# Patient Record
Sex: Female | Born: 1965 | Race: White | Hispanic: No | Smoking: Former smoker
Health system: Southern US, Community
[De-identification: ages and names within clinical notes are randomized; demographics above are authoritative.]

## PROBLEM LIST (undated history)

## (undated) DIAGNOSIS — D649 Anemia, unspecified: Secondary | ICD-10-CM

## (undated) DIAGNOSIS — I10 Essential (primary) hypertension: Secondary | ICD-10-CM

## (undated) DIAGNOSIS — F419 Anxiety disorder, unspecified: Secondary | ICD-10-CM

## (undated) DIAGNOSIS — E039 Hypothyroidism, unspecified: Secondary | ICD-10-CM

## (undated) DIAGNOSIS — G8929 Other chronic pain: Secondary | ICD-10-CM

## (undated) DIAGNOSIS — M549 Dorsalgia, unspecified: Secondary | ICD-10-CM

## (undated) DIAGNOSIS — M1990 Unspecified osteoarthritis, unspecified site: Secondary | ICD-10-CM

## (undated) DIAGNOSIS — F32A Depression, unspecified: Secondary | ICD-10-CM

## (undated) DIAGNOSIS — F329 Major depressive disorder, single episode, unspecified: Secondary | ICD-10-CM

## (undated) HISTORY — DX: Anxiety disorder, unspecified: F41.9

## (undated) HISTORY — DX: Depression, unspecified: F32.A

## (undated) HISTORY — DX: Major depressive disorder, single episode, unspecified: F32.9

---
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
## 1988-11-12 HISTORY — PX: CHOLECYSTECTOMY: SHX55

## 1989-11-12 HISTORY — PX: TUBAL LIGATION: SHX77

## 2004-11-12 HISTORY — PX: ABDOMINAL HYSTERECTOMY: SHX81

## 2010-11-12 HISTORY — PX: OTHER SURGICAL HISTORY: SHX169

## 2012-07-19 ENCOUNTER — Emergency Department (HOSPITAL_COMMUNITY)
Admission: EM | Admit: 2012-07-19 | Discharge: 2012-07-19 | Disposition: A | Discharge status: Home / Self Care | Payer: 59 | Attending: Emergency Medicine | Admitting: Emergency Medicine

## 2012-07-19 ENCOUNTER — Encounter (HOSPITAL_COMMUNITY): Payer: Self-pay

## 2012-07-19 DIAGNOSIS — M519 Unspecified thoracic, thoracolumbar and lumbosacral intervertebral disc disorder: Secondary | ICD-10-CM | POA: Insufficient documentation

## 2012-07-19 DIAGNOSIS — M545 Low back pain, unspecified: Secondary | ICD-10-CM

## 2012-07-19 DIAGNOSIS — Z880 Allergy status to penicillin: Secondary | ICD-10-CM | POA: Insufficient documentation

## 2012-07-19 DIAGNOSIS — E079 Disorder of thyroid, unspecified: Secondary | ICD-10-CM | POA: Insufficient documentation

## 2012-07-19 DIAGNOSIS — I10 Essential (primary) hypertension: Secondary | ICD-10-CM | POA: Insufficient documentation

## 2012-07-19 DIAGNOSIS — G8929 Other chronic pain: Secondary | ICD-10-CM | POA: Insufficient documentation

## 2012-07-19 HISTORY — DX: Dorsalgia, unspecified: M54.9

## 2012-07-19 HISTORY — DX: Essential (primary) hypertension: I10

## 2012-07-19 HISTORY — DX: Other chronic pain: G89.29

## 2012-07-19 MED ORDER — HYDROMORPHONE HCL 4 MG PO TABS: 4.0000 mg | ORAL_TABLET | ORAL | Status: AC | PRN

## 2012-07-19 MED ORDER — HYDROMORPHONE HCL PF 2 MG/ML IJ SOLN
2.0000 mg | Freq: Once | INTRAMUSCULAR | Status: AC
  Administered 2012-07-19: 2 mg via INTRAMUSCULAR
  Filled 2012-07-19: qty 1

## 2012-07-19 NOTE — ED Notes (Signed)
Seeing [facility] and given prednisone and percocet for her pain, called md on call and sts no pain relief with meds given. Hx of bulging disc.

## 2012-07-19 NOTE — ED Provider Notes (Signed)
History   This chart was scribed for Geoffery Lyons, MD by Toya Smothers. The patient was seen in room TR05C/TR05C. Patient's care was started at 1607.  CSN: [id number]  Arrival date & time 07/19/12  1607   First MD Initiated Contact with Patient 07/19/12 1742      No chief complaint on file.  The history is provided by the patient. No language interpreter was used.    Julie Hale is a 46 y.o. female with a h/o chronic lower back pain who presents to the Emergency Department complaining of 1 week of severe constant worsening lower lumbar pain unlike any before. Pain is described as sharp and "knife-like" stabbing Pt denotes that she was evaluated by her PCP Friday for her worsening back pain and prescribed percocet and oxycodone which provide mild relief. Pt denotes associated tinging in her feet and worsening incontinence.  Pt lists PCP as Dr. Penni Bombard  Past Medical History  Diagnosis Date  . Back pain, chronic   . Thyroid disease   . Hypertension     No past surgical history on file.  No family history on file.  History  Substance Use Topics  . Smoking status: Never Smoker   . Smokeless tobacco: Not on file  . Alcohol Use: No   Review of Systems  Constitutional: Negative for fever.       10 Systems reviewed and are negative for acute change except as noted in the HPI.  HENT: Negative for congestion, neck pain and neck stiffness.   Eyes: Negative for discharge and redness.  Respiratory: Negative for cough and shortness of breath.   Cardiovascular: Negative for chest pain.  Gastrointestinal: Negative for vomiting and abdominal pain.  Musculoskeletal: Positive for back pain.  Skin: Negative for rash.  Neurological: Negative for syncope, numbness and headaches.  Psychiatric/Behavioral:       No behavior change.  All other systems reviewed and are negative.    Allergies  Erythromycin and Penicillins  Home Medications   Current Outpatient Rx  Name Route Sig Dispense  Refill  . LEVOTHYROXINE SODIUM 175 MCG PO TABS Oral Take 175 mcg by mouth at bedtime.    . METHOCARBAMOL 500 MG PO TABS Oral Take 500 mg by mouth every 6 (six) hours as needed. For muscle spasms    . METOPROLOL SUCCINATE ER 50 MG PO TB24 Oral Take 50 mg by mouth at bedtime. Take with or immediately following a meal.    . OXYCODONE-ACETAMINOPHEN 5-325 MG PO TABS Oral Take 1 tablet by mouth every 4 (four) hours as needed. For pain    . PREDNISONE 10 MG PO TABS Oral Take 10-60 mg by mouth See admin instructions. Take 60mg  on day 1, 50mg  on day 2, 40mg  on day 3, 30mg  on day 4, 20mg  on day 5 & 10mg  on day 6    . PRESCRIPTION MEDICATION Topical Apply 1 application topically daily. Medication:Topical Testosterone      BP 133/93  Pulse 108  Temp 98.1 F (36.7 C) (Oral)  Resp 22  SpO2 96%  Physical Exam  Nursing note and vitals reviewed. Constitutional: She is oriented to person, place, and time. She appears well-developed. No distress.  HENT:  Head: Normocephalic.  Eyes: Conjunctivae are normal.  Neck: No tracheal deviation present.  Cardiovascular:  No murmur heard. Musculoskeletal: Normal range of motion. She exhibits no edema.       Tender to palpation in the soft tissues of the lower lumbar. Strength is 5/5 in lower extremities.  She can stand on her toes.  Neurological: She is oriented to person, place, and time.  Skin: Skin is warm.  Psychiatric: She has a normal mood and affect.    ED Course  Procedures (including critical care time) DIAGNOSTIC STUDIES: Oxygen Saturation is 96% on room air, adequate by my interpretation.    COORDINATION OF CARE: 17:47- Evaluated Pt. Pt is awake, alert, and oriented.   Labs Reviewed - No data to display No results found.   No diagnosis found.    MDM  The patient presents with a flare of chronic low back pain.  She has a history of bulging discs and is due to have esi on Tuesday.  The medications prescribed by her pcp do not seem to be  helping.  She denies any bowel or bladder complaints, and exam is reassuring.  There are no signs of cauda equina or other emergent cause of this.  She was given im dilaudid and is feeling better.  She will be prescribed a few of these and is to follow up with her esi appointment next week.        I personally performed the services described in this documentation, which was scribed in my presence. The recorded information has been reviewed and considered.      Geoffery Lyons, MD 07/21/12 [phone]

## 2012-08-07 ENCOUNTER — Encounter (HOSPITAL_COMMUNITY): Payer: Self-pay

## 2012-08-08 ENCOUNTER — Ambulatory Visit (HOSPITAL_COMMUNITY)
Admission: RE | Admit: 2012-08-08 | Discharge: 2012-08-08 | Disposition: A | Discharge status: Home / Self Care | Payer: 59 | Source: Ambulatory Visit | Attending: Surgical | Admitting: Surgical

## 2012-08-08 ENCOUNTER — Encounter (HOSPITAL_COMMUNITY)
Admission: RE | Admit: 2012-08-08 | Discharge: 2012-08-08 | Disposition: A | Discharge status: Home / Self Care | Payer: 59 | Source: Ambulatory Visit | Attending: Orthopedic Surgery | Admitting: Orthopedic Surgery

## 2012-08-08 ENCOUNTER — Encounter (HOSPITAL_COMMUNITY): Payer: Self-pay

## 2012-08-08 DIAGNOSIS — M545 Low back pain, unspecified: Secondary | ICD-10-CM | POA: Insufficient documentation

## 2012-08-08 DIAGNOSIS — Z01810 Encounter for preprocedural cardiovascular examination: Secondary | ICD-10-CM | POA: Insufficient documentation

## 2012-08-08 DIAGNOSIS — Z01812 Encounter for preprocedural laboratory examination: Secondary | ICD-10-CM | POA: Insufficient documentation

## 2012-08-08 DIAGNOSIS — Z01818 Encounter for other preprocedural examination: Secondary | ICD-10-CM | POA: Insufficient documentation

## 2012-08-08 HISTORY — DX: Anemia, unspecified: D64.9

## 2012-08-08 HISTORY — DX: Unspecified osteoarthritis, unspecified site: M19.90

## 2012-08-08 HISTORY — DX: Hypothyroidism, unspecified: E03.9

## 2012-08-08 LAB — URINALYSIS, ROUTINE W REFLEX MICROSCOPIC
Bilirubin Urine: NEGATIVE
Glucose, UA: NEGATIVE mg/dL
Hgb urine dipstick: NEGATIVE
Ketones, ur: NEGATIVE mg/dL
Leukocytes, UA: NEGATIVE
Nitrite: NEGATIVE
Protein, ur: NEGATIVE mg/dL
Specific Gravity, Urine: 1.006 (ref 1.005–1.030)
Urobilinogen, UA: 0.2 mg/dL (ref 0.0–1.0)
pH: 6 (ref 5.0–8.0)

## 2012-08-08 LAB — SURGICAL PCR SCREEN: MRSA, PCR: NEGATIVE

## 2012-08-08 LAB — COMPREHENSIVE METABOLIC PANEL
ALT: 25 U/L (ref 0–35)
AST: 20 U/L (ref 0–37)
Albumin: 4 g/dL (ref 3.5–5.2)
Alkaline Phosphatase: 82 U/L (ref 39–117)
BUN: 11 mg/dL (ref 6–23)
CO2: 25 mEq/L (ref 19–32)
Calcium: 9.4 mg/dL (ref 8.4–10.5)
Chloride: 102 mEq/L (ref 96–112)
Creatinine, Ser: 0.85 mg/dL (ref 0.50–1.10)
GFR calc Af Amer: >90 mL/min (ref >90)
GFR calc non Af Amer: 80 mL/min — ABNORMAL LOW (ref >90)
Glucose, Bld: 101 mg/dL — ABNORMAL HIGH (ref 70–99)
Potassium: 5.1 mEq/L (ref 3.5–5.1)
Sodium: 136 mEq/L (ref 135–145)
Total Bilirubin: 0.4 mg/dL (ref 0.3–1.2)
Total Protein: 7.6 g/dL (ref 6.0–8.3)

## 2012-08-08 LAB — APTT: aPTT: 28 seconds (ref 24–37)

## 2012-08-08 LAB — PROTIME-INR
INR: 0.99 (ref 0.00–1.49)
Prothrombin Time: 13 seconds (ref 11.6–15.2)

## 2012-08-08 LAB — CBC
MCH: 32 pg (ref 26.0–34.0)
MCV: 95.1 fL (ref 78.0–100.0)
Platelets: 293 10*3/uL (ref 150–400)
RDW: 13 % (ref 11.5–15.5)
WBC: 13.7 10*3/uL — ABNORMAL HIGH (ref 4.0–10.5)

## 2012-08-08 NOTE — Patient Instructions (Addendum)
20 Tiye Huwe  08/08/2012   Your procedure is scheduled on:  08-14-2012  Report to [facility] at 0930  AM.  Call this number if you have problems the morning of surgery: [phone]   Remember:   Do not eat food or drink liquids:After Midnight.  .  Take these medicines the morning of surgery with A SIP OF WATER: oxycodone if needed, levothyroxine, metorprolol succinate, prednisone if still taking   Do not wear jewelry or make up.  Do not wear lotions, powders, or perfumes. You may wear deodorant.    Do not bring valuables to the hospital.  Contacts, dentures or bridgework may not be worn into surgery.  Leave suitcase in the car. After surgery it may be brought to your room.  For patients admitted to the hospital, checkout time is 11:00 AM the day of discharge                             Patients discharged the day of surgery will not be allowed to drive home. If going home same day of surgery, you must have someone stay with you the first 24 hours at home and arrange for some one to drive you home from hospital.    Special Instructions: See [facility] Preparing for Surgery instruction sheet. Women do not shave legs or underarms for 12 hours before showers. Men may shave face morning of surgery.    Please read over the following fact sheets that you were given: MRSA Information  [facility] pre op nurse phone number [phone], call if needed

## 2012-08-12 NOTE — Progress Notes (Signed)
Left pt message surgery time changed to 930 am, arrive 0700 am [facility] 08-14-12

## 2012-08-12 NOTE — Progress Notes (Signed)
Spoke with pt by phone aware surgery time changed to 930 am arrive 0700 am 08-14-12 [facility]

## 2012-08-12 NOTE — H&P (Signed)
  Julie Hale DOB: 1966-08-18  Chief Compliant: back pain   History of Present Illness The patient is a 46 year old female who is scheduled for a lumbar laminectomy and microdisectomy by Dr. Darrelyn Hillock on August 14, 2012. The patient reports low back symptoms including low back pain which began 6 week without any known injury. and Symptoms include pain, numbness and tingling. The pain radiates to the left buttock, left thigh, left lower leg and left foot. The patient describes the pain as sharp and tingling. The patient describes the severity of their symptoms as moderate in severity. The patient feels as if the symptoms are worsening. Symptoms are exacerbated by standing, sitting, lifting and bending. Associated symptoms include urinary incontinence. Current treatment includes opioid analgesics, which is not helping much. Prior to being seen today the patient was previously evaluated by a colleague Dr. Penni Bombard.  The bottom line is she has a large lateral recess herniated disc at L2-3.     Allergies Penicillin Erythromicin   Medication History Percocet (5-325MG  Tablet, 1 (one) Tablet Oral every 6-8 hours as needed for pain,  Active. Naproxen (500MG  Tablet, 1 (one) Oral two times daily, ) Active. (with food) Levothyroxine Sodium ( Intravenous) - Active. Methocarbamol (500MG  Tablet, Oral) Active. Metoprolol Succinate ( Oral) 50mg  - Active. Testosterone Aqueous ( Intramuscular)  - Active. VESIcare ( Oral)  - Active. Dilaudid 4mg    Review of Systems General:Not Present- Chills, Fever, Night Sweats, Appetite Loss, Fatigue, Feeling sick, Weight Gain and Weight Loss. Skin:Not Present- Itching, Rash, Skin Color Changes, Ulcer, Psoriasis and Change in Hair or Nails. HEENT:Not Present- Sensitivity to light, Nose Bleed, Visual Loss, Decreased Hearing and Ringing in the Ears. Neck:Not Present- Swollen Glands and Neck Mass. Respiratory:Not Present- Shortness of breath,  Snoring, Chronic Cough and Bloody sputum. Cardiovascular:Not Present- Shortness of Breath, Chest Pain, Swelling of Extremities, Leg Cramps and Palpitations. Gastrointestinal:Present- Heartburn. Not Present- Bloody Stool, Abdominal Pain, Vomiting, Nausea and Incontinence of Stool. Female Genitourinary:Not Present- Blood in Urine, Irregular/missing periods, Frequency, Incontinence and Nocturia. Musculoskeletal:Present- Muscle Weakness, Joint Stiffness, Joint Swelling, Joint Pain and Back Pain. Not Present- Muscle Pain. Neurological:Not Present- Tingling, Numbness, Burning, Tremor, Headaches and Dizziness. Psychiatric:Not Present- Anxiety, Depression and Memory Loss. Endocrine:Present- Cold Intolerance. Not Present- Heat Intolerance, Excessive hunger and Excessive Thirst. Hematology:Not Present- Abnormal Bleeding, Abnormal bruising, Anemia and Blood Clots.    Physical Exam She has numbness lateral aspect on exam of the left leg. Muscle testing exam is surprisingly intact except her hip flexors are weak on the left. Back exam is painful when she sits she leans toward the right. Lungs are clear to auscultation Heart - normal sinus rhythm, no murmur. Oral cavity negative Neck: supple, no bruit Abdomen: soft and nontender    RADIOGRAPHS: On AP view she has sixlumbar vertebrae but if you count up from the bottom up L5-S1 is the last space.     Assessment & Plan Lumbar disc herniation (722.10) She will need a lumbar laminectomy and microdiscectomy at L2-3 on the left. Dr. Darrelyn Hillock went over possible complications which are extremely rare.       Dimitri Ped, PA-C

## 2012-08-14 ENCOUNTER — Encounter (HOSPITAL_COMMUNITY): Payer: Self-pay | Admitting: *Deleted

## 2012-08-14 ENCOUNTER — Inpatient Hospital Stay (HOSPITAL_COMMUNITY): Payer: 59 | Admitting: Anesthesiology

## 2012-08-14 ENCOUNTER — Encounter (HOSPITAL_COMMUNITY)
Admission: RE | Disposition: A | Discharge status: Home / Self Care | Payer: Self-pay | Source: Ambulatory Visit | Attending: Orthopedic Surgery

## 2012-08-14 ENCOUNTER — Inpatient Hospital Stay (HOSPITAL_COMMUNITY): Payer: 59

## 2012-08-14 ENCOUNTER — Encounter (HOSPITAL_COMMUNITY): Payer: Self-pay

## 2012-08-14 ENCOUNTER — Encounter (HOSPITAL_COMMUNITY): Payer: Self-pay | Admitting: Anesthesiology

## 2012-08-14 ENCOUNTER — Inpatient Hospital Stay (HOSPITAL_COMMUNITY)
Admission: RE | Admit: 2012-08-14 | Discharge: 2012-08-15 | DRG: 491 | Disposition: A | Discharge status: Home / Self Care | Payer: 59 | Source: Ambulatory Visit | Attending: Orthopedic Surgery | Admitting: Orthopedic Surgery

## 2012-08-14 DIAGNOSIS — Z881 Allergy status to other antibiotic agents status: Secondary | ICD-10-CM

## 2012-08-14 DIAGNOSIS — N39498 Other specified urinary incontinence: Secondary | ICD-10-CM | POA: Diagnosis present

## 2012-08-14 DIAGNOSIS — Z880 Allergy status to penicillin: Secondary | ICD-10-CM

## 2012-08-14 DIAGNOSIS — Z791 Long term (current) use of non-steroidal anti-inflammatories (NSAID): Secondary | ICD-10-CM

## 2012-08-14 DIAGNOSIS — M5106 Intervertebral disc disorders with myelopathy, lumbar region: Principal | ICD-10-CM | POA: Diagnosis present

## 2012-08-14 HISTORY — PX: LUMBAR LAMINECTOMY/DECOMPRESSION MICRODISCECTOMY: SHX5026

## 2012-08-14 SURGERY — LUMBAR LAMINECTOMY/DECOMPRESSION MICRODISCECTOMY
Anesthesia: General | Site: Back | Laterality: Left | Wound class: Clean

## 2012-08-14 MED ORDER — HYDROMORPHONE HCL PF 1 MG/ML IJ SOLN
0.2500 mg | INTRAMUSCULAR | Status: DC | PRN
  Administered 2012-08-14 (×2): 0.5 mg via INTRAVENOUS

## 2012-08-14 MED ORDER — LACTATED RINGERS IV SOLN
INTRAVENOUS | Status: DC
  Administered 2012-08-14: 21:00:00 via INTRAVENOUS

## 2012-08-14 MED ORDER — DEXAMETHASONE SODIUM PHOSPHATE 10 MG/ML IJ SOLN
INTRAMUSCULAR | Status: DC | PRN
  Administered 2012-08-14: 10 mg via INTRAVENOUS

## 2012-08-14 MED ORDER — MEPERIDINE HCL 50 MG/ML IJ SOLN: 6.2500 mg | INTRAMUSCULAR | Status: DC | PRN

## 2012-08-14 MED ORDER — ACETAMINOPHEN 325 MG PO TABS: 650.0000 mg | ORAL_TABLET | ORAL | Status: DC | PRN

## 2012-08-14 MED ORDER — PROMETHAZINE HCL 25 MG/ML IJ SOLN
6.2500 mg | INTRAMUSCULAR | Status: DC | PRN
  Administered 2012-08-14: 12.5 mg via INTRAVENOUS

## 2012-08-14 MED ORDER — METHOCARBAMOL 500 MG PO TABS
500.0000 mg | ORAL_TABLET | Freq: Four times a day (QID) | ORAL | Status: DC | PRN
  Administered 2012-08-14 – 2012-08-15 (×3): 500 mg via ORAL
  Filled 2012-08-14 (×3): qty 1

## 2012-08-14 MED ORDER — METOPROLOL SUCCINATE ER 50 MG PO TB24
50.0000 mg | ORAL_TABLET | Freq: Every day | ORAL | Status: DC
  Administered 2012-08-15: 50 mg via ORAL
  Filled 2012-08-14: qty 1

## 2012-08-14 MED ORDER — OXYCODONE HCL 5 MG/5ML PO SOLN
5.0000 mg | Freq: Once | ORAL | Status: DC | PRN
  Filled 2012-08-14: qty 5

## 2012-08-14 MED ORDER — LACTATED RINGERS IV SOLN
INTRAVENOUS | Status: DC
  Administered 2012-08-14: 10:00:00 via INTRAVENOUS
  Administered 2012-08-14: 1000 mL via INTRAVENOUS
  Administered 2012-08-14: 12:00:00 via INTRAVENOUS

## 2012-08-14 MED ORDER — FENTANYL CITRATE 0.05 MG/ML IJ SOLN
100.0000 ug | Freq: Once | INTRAMUSCULAR | Status: AC
  Administered 2012-08-14: 100 ug via INTRAVENOUS

## 2012-08-14 MED ORDER — MIDAZOLAM HCL 5 MG/5ML IJ SOLN
INTRAMUSCULAR | Status: DC | PRN
  Administered 2012-08-14: 2 mg via INTRAVENOUS

## 2012-08-14 MED ORDER — FENTANYL CITRATE 0.05 MG/ML IJ SOLN
INTRAMUSCULAR | Status: DC | PRN
  Administered 2012-08-14 (×2): 50 ug via INTRAVENOUS
  Administered 2012-08-14: 100 ug via INTRAVENOUS
  Administered 2012-08-14: 50 ug via INTRAVENOUS

## 2012-08-14 MED ORDER — OXYCODONE-ACETAMINOPHEN 5-325 MG PO TABS
1.0000 | ORAL_TABLET | ORAL | Status: DC | PRN
  Administered 2012-08-14 – 2012-08-15 (×5): 2 via ORAL
  Filled 2012-08-14 (×6): qty 2

## 2012-08-14 MED ORDER — ROCURONIUM BROMIDE 100 MG/10ML IV SOLN
INTRAVENOUS | Status: DC | PRN
  Administered 2012-08-14: 50 mg via INTRAVENOUS

## 2012-08-14 MED ORDER — SODIUM CHLORIDE 0.9 % IR SOLN
Status: DC | PRN
  Administered 2012-08-14: 11:00:00

## 2012-08-14 MED ORDER — EPHEDRINE SULFATE 50 MG/ML IJ SOLN
INTRAMUSCULAR | Status: DC | PRN
  Administered 2012-08-14 (×2): 5 mg via INTRAVENOUS

## 2012-08-14 MED ORDER — CLINDAMYCIN PHOSPHATE 900 MG/50ML IV SOLN
900.0000 mg | INTRAVENOUS | Status: AC
  Administered 2012-08-14: 900 mg via INTRAVENOUS
  Filled 2012-08-14: qty 50

## 2012-08-14 MED ORDER — NEOSTIGMINE METHYLSULFATE 1 MG/ML IJ SOLN
INTRAMUSCULAR | Status: DC | PRN
  Administered 2012-08-14: 3 mg via INTRAVENOUS

## 2012-08-14 MED ORDER — PHENOL 1.4 % MT LIQD
1.0000 | OROMUCOSAL | Status: DC | PRN
  Filled 2012-08-14: qty 177

## 2012-08-14 MED ORDER — HYDROMORPHONE HCL PF 1 MG/ML IJ SOLN
0.5000 mg | INTRAMUSCULAR | Status: DC | PRN
  Administered 2012-08-14 – 2012-08-15 (×2): 1 mg via INTRAVENOUS
  Filled 2012-08-14 (×2): qty 1

## 2012-08-14 MED ORDER — ACETAMINOPHEN 10 MG/ML IV SOLN: 1000.0000 mg | Freq: Once | INTRAVENOUS | Status: DC | PRN

## 2012-08-14 MED ORDER — BUPIVACAINE LIPOSOME 1.3 % IJ SUSP
20.0000 mL | INTRAMUSCULAR | Status: AC
  Administered 2012-08-14: 15 mL
  Filled 2012-08-14: qty 20

## 2012-08-14 MED ORDER — BACITRACIN-NEOMYCIN-POLYMYXIN 400-5-5000 EX OINT
TOPICAL_OINTMENT | CUTANEOUS | Status: DC | PRN
  Administered 2012-08-14: 1 via TOPICAL

## 2012-08-14 MED ORDER — THROMBIN 5000 UNITS EX SOLR
CUTANEOUS | Status: DC | PRN
  Administered 2012-08-14: 5000 [IU] via TOPICAL

## 2012-08-14 MED ORDER — BUPIVACAINE HCL (PF) 0.25 % IJ SOLN
INTRAMUSCULAR | Status: DC | PRN
  Administered 2012-08-14: 20 mL

## 2012-08-14 MED ORDER — LEVOTHYROXINE SODIUM 175 MCG PO TABS
175.0000 ug | ORAL_TABLET | Freq: Every day | ORAL | Status: DC
  Administered 2012-08-15: 175 ug via ORAL
  Filled 2012-08-14 (×2): qty 1

## 2012-08-14 MED ORDER — GLYCOPYRROLATE 0.2 MG/ML IJ SOLN
INTRAMUSCULAR | Status: DC | PRN
  Administered 2012-08-14: 0.4 mg via INTRAVENOUS

## 2012-08-14 MED ORDER — LIDOCAINE HCL (CARDIAC) 20 MG/ML IV SOLN
INTRAVENOUS | Status: DC | PRN
  Administered 2012-08-14: 80 mg via INTRAVENOUS

## 2012-08-14 MED ORDER — ONDANSETRON HCL 4 MG/2ML IJ SOLN
INTRAMUSCULAR | Status: DC | PRN
  Administered 2012-08-14: 4 mg via INTRAVENOUS

## 2012-08-14 MED ORDER — ONDANSETRON HCL 4 MG/2ML IJ SOLN: 4.0000 mg | INTRAMUSCULAR | Status: DC | PRN

## 2012-08-14 MED ORDER — ACETAMINOPHEN 10 MG/ML IV SOLN
INTRAVENOUS | Status: DC | PRN
  Administered 2012-08-14: 1000 mg via INTRAVENOUS

## 2012-08-14 MED ORDER — PROMETHAZINE HCL 25 MG/ML IJ SOLN
INTRAMUSCULAR | Status: AC
  Filled 2012-08-14: qty 1

## 2012-08-14 MED ORDER — OXYCODONE HCL 5 MG PO TABS: 5.0000 mg | ORAL_TABLET | Freq: Once | ORAL | Status: DC | PRN

## 2012-08-14 MED ORDER — SCOPOLAMINE 1 MG/3DAYS TD PT72
1.0000 | MEDICATED_PATCH | Freq: Once | TRANSDERMAL | Status: DC
  Administered 2012-08-14: 1.5 mg via TRANSDERMAL
  Filled 2012-08-14: qty 1

## 2012-08-14 MED ORDER — PROPOFOL 10 MG/ML IV BOLUS
INTRAVENOUS | Status: DC | PRN
  Administered 2012-08-14: 180 mg via INTRAVENOUS

## 2012-08-14 MED ORDER — ACETAMINOPHEN 650 MG RE SUPP: 650.0000 mg | RECTAL | Status: DC | PRN

## 2012-08-14 MED ORDER — DEXTROSE 5 % IV SOLN
500.0000 mg | Freq: Four times a day (QID) | INTRAVENOUS | Status: DC | PRN
  Administered 2012-08-14: 500 mg via INTRAVENOUS
  Filled 2012-08-14: qty 5

## 2012-08-14 MED ORDER — MENTHOL 3 MG MT LOZG
1.0000 | LOZENGE | OROMUCOSAL | Status: DC | PRN
  Filled 2012-08-14: qty 9

## 2012-08-14 MED ORDER — CLINDAMYCIN PHOSPHATE 900 MG/50ML IV SOLN
900.0000 mg | Freq: Three times a day (TID) | INTRAVENOUS | Status: AC
  Administered 2012-08-14 – 2012-08-15 (×2): 900 mg via INTRAVENOUS
  Filled 2012-08-14 (×2): qty 50

## 2012-08-14 SURGICAL SUPPLY — 42 items
BAG ZIPLOCK 12X15 (MISCELLANEOUS) ×2 IMPLANT
BENZOIN TINCTURE PRP APPL 2/3 (GAUZE/BANDAGES/DRESSINGS) ×2 IMPLANT
CLEANER TIP ELECTROSURG 2X2 (MISCELLANEOUS) ×2 IMPLANT
CLOTH BEACON ORANGE TIMEOUT ST (SAFETY) ×2 IMPLANT
CONT SPECI 4OZ STER CLIK (MISCELLANEOUS) ×2 IMPLANT
DRAIN PENROSE 18X1/4 LTX STRL (WOUND CARE) IMPLANT
DRAPE MICROSCOPE LEICA (MISCELLANEOUS) ×2 IMPLANT
DRAPE POUCH INSTRU U-SHP 10X18 (DRAPES) ×2 IMPLANT
DRAPE SURG 17X11 SM STRL (DRAPES) ×2 IMPLANT
DRSG ADAPTIC 3X8 NADH LF (GAUZE/BANDAGES/DRESSINGS) ×2 IMPLANT
DRSG PAD ABDOMINAL 8X10 ST (GAUZE/BANDAGES/DRESSINGS) ×4 IMPLANT
DURAPREP 26ML APPLICATOR (WOUND CARE) ×2 IMPLANT
ELECT REM PT RETURN 9FT ADLT (ELECTROSURGICAL) ×2
ELECTRODE REM PT RTRN 9FT ADLT (ELECTROSURGICAL) ×1 IMPLANT
GLOVE BIOGEL PI IND STRL 8 (GLOVE) ×1 IMPLANT
GLOVE BIOGEL PI INDICATOR 8 (GLOVE) ×1
GLOVE ECLIPSE 8.0 STRL XLNG CF (GLOVE) ×4 IMPLANT
GOWN PREVENTION PLUS LG XLONG (DISPOSABLE) ×4 IMPLANT
GOWN STRL REIN XL XLG (GOWN DISPOSABLE) ×4 IMPLANT
KIT BASIN OR (CUSTOM PROCEDURE TRAY) ×2 IMPLANT
KIT POSITIONING SURG ANDREWS (MISCELLANEOUS) ×2 IMPLANT
MANIFOLD NEPTUNE II (INSTRUMENTS) ×2 IMPLANT
NEEDLE SPNL 18GX3.5 QUINCKE PK (NEEDLE) ×4 IMPLANT
NS IRRIG 1000ML POUR BTL (IV SOLUTION) IMPLANT
PATTIES SURGICAL .5 X.5 (GAUZE/BANDAGES/DRESSINGS) IMPLANT
PATTIES SURGICAL .75X.75 (GAUZE/BANDAGES/DRESSINGS) IMPLANT
PATTIES SURGICAL 1X1 (DISPOSABLE) IMPLANT
PIN SAFETY NICK PLATE  2 MED (MISCELLANEOUS)
PIN SAFETY NICK PLATE 2 MED (MISCELLANEOUS) IMPLANT
POSITIONER SURGICAL ARM (MISCELLANEOUS) IMPLANT
SPONGE GAUZE 4X4 12PLY (GAUZE/BANDAGES/DRESSINGS) ×2 IMPLANT
SPONGE LAP 4X18 X RAY DECT (DISPOSABLE) ×6 IMPLANT
SPONGE SURGIFOAM ABS GEL 100 (HEMOSTASIS) ×2 IMPLANT
STAPLER VISISTAT 35W (STAPLE) ×2 IMPLANT
SUT VIC AB 0 CT1 27 (SUTURE) ×1
SUT VIC AB 0 CT1 27XBRD ANTBC (SUTURE) ×1 IMPLANT
SUT VIC AB 1 CT1 27 (SUTURE) ×3
SUT VIC AB 1 CT1 27XBRD ANTBC (SUTURE) ×3 IMPLANT
TAPE CLOTH SURG 6X10 WHT LF (GAUZE/BANDAGES/DRESSINGS) ×2 IMPLANT
TOWEL OR 17X26 10 PK STRL BLUE (TOWEL DISPOSABLE) ×4 IMPLANT
TRAY LAMINECTOMY (CUSTOM PROCEDURE TRAY) ×2 IMPLANT
WATER STERILE IRR 1500ML POUR (IV SOLUTION) IMPLANT

## 2012-08-14 NOTE — Anesthesia Postprocedure Evaluation (Signed)
Anesthesia Post Note  Patient: [name]  Procedure(s) Performed: Procedure(s) (LRB): LUMBAR LAMINECTOMY/DECOMPRESSION MICRODISCECTOMY (Left)  Anesthesia type: General  Patient location: PACU  Post pain: Pain level controlled  Post assessment: Post-op Vital signs reviewed  Last Vitals: BP 133/73  Pulse 80  Temp 37.1 C (Oral)  Resp 17  SpO2 99%  Post vital signs: Reviewed  Level of consciousness: sedated  Complications: No apparent anesthesia complications

## 2012-08-14 NOTE — Transfer of Care (Signed)
Immediate Anesthesia Transfer of Care Note  Patient: Julie Hale  Procedure(s) Performed: Procedure(s) (LRB) with comments: LUMBAR LAMINECTOMY/DECOMPRESSION MICRODISCECTOMY (Left) - lumbar 2- 3 left  Patient Location: PACU  Anesthesia Type: General  Level of Consciousness: awake, alert  and oriented  Airway & Oxygen Therapy: Patient Spontanous Breathing and Patient connected to face mask oxygen  Post-op Assessment: Report given to PACU RN and Post -op Vital signs reviewed and stable  Post vital signs: Reviewed and stable  Complications: No apparent anesthesia complications

## 2012-08-14 NOTE — Anesthesia Preprocedure Evaluation (Addendum)
Anesthesia Evaluation  Patient identified by MRN, date of birth, ID band Patient awake    Reviewed: Allergy & Precautions, H&P , NPO status , Patient's Chart, lab work & pertinent test results, reviewed documented beta blocker date and time   Airway Mallampati: II TM Distance: >3 FB Neck ROM: Full    Dental  (+) Dental Advisory Given and Teeth Intact   Pulmonary former smoker,  breath sounds clear to auscultation  Pulmonary exam normal       Cardiovascular hypertension, Pt. on medications and Pt. on home beta blockers - Past MI Rhythm:Regular Rate:Normal     Neuro/Psych negative neurological ROS  negative psych ROS   GI/Hepatic negative GI ROS, Neg liver ROS,   Endo/Other  Hypothyroidism Morbid obesity  Renal/GU negative Renal ROS     Musculoskeletal   Abdominal (+) + obese,   Peds  Hematology  (+) anemia ,   Anesthesia Other Findings   Reproductive/Obstetrics                         Anesthesia Physical Anesthesia Plan  ASA: III  Anesthesia Plan: General   Post-op Pain Management:    Induction: Intravenous  Airway Management Planned: Oral ETT  Additional Equipment:   Intra-op Plan:   Post-operative Plan: Extubation in OR  Informed Consent: I have reviewed the patients History and Physical, chart, labs and discussed the procedure including the risks, benefits and alternatives for the proposed anesthesia with the patient or authorized representative who has indicated his/her understanding and acceptance.   Dental advisory given  Plan Discussed with: CRNA  Anesthesia Plan Comments:         Anesthesia Quick Evaluation

## 2012-08-14 NOTE — Interval H&P Note (Signed)
History and Physical Interval Note:  08/14/2012 8:58 AM  Julie Hale  has presented today for surgery, with the diagnosis of lumbar disc rupture L2-L3 on left  The various methods of treatment have been discussed with the patient and family. After consideration of risks, benefits and other options for treatment, the patient has consented to  Procedure(s) (LRB) with comments: LUMBAR LAMINECTOMY/DECOMPRESSION MICRODISCECTOMY (Left) as a surgical intervention .  The patient's history has been reviewed, patient examined, no change in status, stable for surgery.  I have reviewed the patient's chart and labs.  Questions were answered to the patient's satisfaction.     Julie Hale A

## 2012-08-14 NOTE — Brief Op Note (Signed)
08/14/2012  11:39 AM  PATIENT:  Julie Hale  46 y.o. female  PRE-OPERATIVE DIAGNOSIS:  lumbar disc rupture L2-L3 on left with Spinal Stenosis  POST-OPERATIVE DIAGNOSIS:  lumbar disc rupture L2-L3 on left with Spinal Stenosis  PROCEDURE:  Procedure(s) (LRB) with comments: LUMBAR LAMINECTOMY/DECOMPRESSION MICRODISCECTOMY (Left) - lumbar 2- 3 left and foraminotomies for TWO ROOTS,L-2 and L-3 Nerve roots were decompressed.  SURGEON:  Surgeon(s) and Role:    * Jacki Cones, MD - Primary    * Javier Docker, MD - Assisting     ASSISTANTS: Jene Every MD }  ANESTHESIA:   general  EBL:  Total I/O In: 1000 [I.V.:1000] Out: -   BLOOD ADMINISTERED:none  DRAINS: none   LOCAL MEDICATIONS USED:  MARCAINE 0.25%,20cc with Epinephrine. At end of case,Bupivicaine 20cc mixed with 10cc of Normal Saline    SPECIMEN:  Source of Specimen:  L-2___  L-3 Disc  DISPOSITION OF SPECIMEN:  PATHOLOGY  COUNTS:  YES  TOURNIQUET:  * No tourniquets in log *  DICTATION: .Other Dictation: Dictation Number [id number]  PLAN OF CARE: Admit to inpatient   PATIENT DISPOSITION:  PACU - hemodynamically stable.   Delay start of Pharmacological VTE agent (>24hrs) due to surgical blood loss or risk of bleeding: yes

## 2012-08-15 ENCOUNTER — Encounter (HOSPITAL_COMMUNITY): Payer: Self-pay | Admitting: Orthopedic Surgery

## 2012-08-15 MED ORDER — METHOCARBAMOL 500 MG PO TABS: 500.0000 mg | ORAL_TABLET | Freq: Four times a day (QID) | ORAL | Status: DC | PRN

## 2012-08-15 MED ORDER — OXYCODONE-ACETAMINOPHEN 5-325 MG PO TABS: 1.0000 | ORAL_TABLET | ORAL | Status: DC | PRN

## 2012-08-15 MED FILL — Bupivacaine Inj 0.5% w/ Epinephrine 1:200000 (PF): INTRAMUSCULAR | Qty: 30 | Status: AC

## 2012-08-15 MED FILL — Bacitracin Zinc Oint 500 Unit/GM: CUTANEOUS | Qty: 15 | Status: AC

## 2012-08-15 MED FILL — Thrombin For Soln 5000 Unit: CUTANEOUS | Qty: 5000 | Status: AC

## 2012-08-15 NOTE — Evaluation (Signed)
Occupational Therapy Evaluation Patient Details Name: Julie Hale MRN: [id number] DOB: Apr 01, 1966 Today's Date: 08/15/2012 Time: 4098-1191 OT Time Calculation (min): 22 min  OT Assessment / Plan / Recommendation Clinical Impression  This 46 year old female underwent decompression of L2-3.  All education was completed, and she does not need any further OT.      OT Assessment       Follow Up Recommendations  No OT follow up    Barriers to Discharge      Equipment Recommendations  Other (comment) (RW 5" wheels/3:1 commode unless she can borrow)    Recommendations for Other Services    Frequency       Precautions / Restrictions Precautions Precautions: Back Precaution Booklet Issued: Yes (comment) Precaution Comments: Pt recalled all back precautions at end of session Restrictions Weight Bearing Restrictions: No   Pertinent Vitals/Pain 5/10 back; repositioned    ADL  Lower Body Bathing: Simulated;Minimal assistance Where Assessed - Lower Body Bathing: Supported sit to stand Lower Body Dressing: Simulated;Moderate assistance Where Assessed - Lower Body Dressing: Supported sit to stand Toilet Transfer: Counsellor Method: Sit to Barista:  (chair) Transfers/Ambulation Related to ADLs: Pt min guard for sit to stand and ambulation in room.  Did not feel she needed to practice shower transfer:  reviewed sequence, and she has grab bars, help from husband ADL Comments: reviewed adls with back precautions and handout provided.  Pt will be most independent lying down for LB ADLs. Does not have AE.  Will likely need toilet aid:  resource list given    OT Diagnosis:    OT Problem List:   OT Treatment Interventions:     OT Goals    Visit Information  Last OT Received On: 08/15/12 Assistance Needed: +1    Subjective Data  Subjective: someone told me I could borrow equipment:  I have to figure out who it was.     Prior  Functioning     Home Living Lives With: Spouse Available Help at Discharge: Family Type of Home: House Home Access: Stairs to enter Secretary/administrator of Steps: 1 Home Layout: One level Bathroom Shower/Tub: Walk-in shower (grab bar) Bathroom Toilet: Standard (grab bar) Home Adaptive Equipment: None (Pt states can borrow RW and will check on same) Prior Function Level of Independence: Independent Able to Take Stairs?: Yes Driving: Yes Vocation: Full time employment Communication Communication: No difficulties         Vision/Perception     Cognition  Overall Cognitive Status: Appears within functional limits for tasks assessed/performed Arousal/Alertness: Awake/alert Orientation Level: Appears intact for tasks assessed Behavior During Session: [facility] for tasks performed    Extremity/Trunk Assessment Right Upper Extremity Assessment RUE ROM/Strength/Tone: [facility] for tasks assessed Left Upper Extremity Assessment LUE ROM/Strength/Tone: [facility] for tasks assessed Right Lower Extremity Assessment RLE ROM/Strength/Tone: [facility] for tasks assessed Left Lower Extremity Assessment LLE ROM/Strength/Tone: WFL for tasks assessed     Mobility Bed Mobility Bed Mobility: Rolling Right;Sit to Supine Rolling Right: 5: Supervision Sit to Supine: 5: Supervision Details for Bed Mobility Assistance: min cues for correct log roll technique Transfers Sit to Stand: 4: Min guard Stand to Sit: 4: Min guard;5: Supervision Details for Transfer Assistance: cues for use of UEs, pt did well limiting fwd flex     Shoulder Instructions     Exercise     Balance     End of Session OT - End of Session Activity Tolerance: Patient tolerated treatment well Patient  left: in chair;with call bell/phone within reach  GO     Julie Hale 08/15/2012, 9:25 AM Marica Otter, OTR/L [phone] 08/15/2012

## 2012-08-15 NOTE — Evaluation (Signed)
Physical Therapy Evaluation Patient Details Name: Julie Hale MRN: [id number] DOB: 1966/07/06 Today's Date: 08/15/2012 Time: 0805-0825 PT Time Calculation (min): 20 min  PT Assessment / Plan / Recommendation Clinical Impression  Pt sp back surgery presents with functional mobility limitations 2* back pain and post op back precautions    PT Assessment  Patient needs continued PT services    Follow Up Recommendations  No PT follow up    Barriers to Discharge        Equipment Recommendations  None recommended by PT (Pt states can borrow RW)    Recommendations for Other Services OT consult   Frequency 7X/week    Precautions / Restrictions Precautions Precautions: Back Precaution Booklet Issued: Yes (comment) Precaution Comments: Pt recalled all back precautions at end of session Restrictions Weight Bearing Restrictions: No   Pertinent Vitals/Pain 5/10 premecidated      Mobility  Bed Mobility Bed Mobility: Rolling Right;Sit to Supine Rolling Right: 5: Supervision Sit to Supine: 5: Supervision Details for Bed Mobility Assistance: min cues for correct log roll technique Transfers Transfers: Sit to Stand;Stand to Sit Sit to Stand: 4: Min guard;5: Supervision Stand to Sit: 4: Min guard;5: Supervision Details for Transfer Assistance: cues for use of UEs, pt did well limiting fwd flex Ambulation/Gait Ambulation/Gait Assistance: 4: Min guard;5: Supervision Ambulation Distance (Feet): 200 Feet Assistive device: Rolling walker Ambulation/Gait Assistance Details: cues for posture and position from RW Gait Pattern: Step-to pattern;Step-through pattern Stairs:  (Reviewed step up verbally, )    Shoulder Instructions     Exercises     PT Diagnosis: Difficulty walking  PT Problem List: Decreased activity tolerance;Decreased mobility;Decreased knowledge of use of DME;Pain PT Treatment Interventions: DME instruction;Gait training;Stair training;Functional mobility  training;Therapeutic activities;Patient/family education   PT Goals Acute Rehab PT Goals PT Goal Formulation: With patient Time For Goal Achievement: 08/17/12 Potential to Achieve Goals: Good Pt will go Supine/Side to Sit: with supervision PT Goal: Supine/Side to Sit - Progress: Goal set today Pt will go Sit to Supine/Side: with supervision PT Goal: Sit to Supine/Side - Progress: Goal set today Pt will go Sit to Stand: with supervision PT Goal: Sit to Stand - Progress: Goal set today Pt will go Stand to Sit: with supervision PT Goal: Stand to Sit - Progress: Goal set today Pt will Ambulate: >150 feet;with supervision;with rolling walker PT Goal: Ambulate - Progress: Goal set today Pt will Go Up / Down Stairs: 1-2 stairs;with min assist;with least restrictive assistive device PT Goal: Up/Down Stairs - Progress: Goal set today  Visit Information  Last PT Received On: 08/15/12 Assistance Needed: +1    Subjective Data  Subjective: I have no pain in my leg anymore, its all in my back Patient Stated Goal: Back to work in a few weeks   Prior Functioning  Home Living Lives With: Spouse Available Help at Discharge: Family Type of Home: House Home Access: Stairs to enter Secretary/administrator of Steps: 1 Home Layout: One level Home Adaptive Equipment: None (Pt states can borrow RW and will check on same) Prior Function Level of Independence: Independent Able to Take Stairs?: Yes Driving: Yes Vocation: Full time employment Communication Communication: No difficulties    Cognition  Overall Cognitive Status: Appears within functional limits for tasks assessed/performed Arousal/Alertness: Awake/alert Orientation Level: Appears intact for tasks assessed Behavior During Session: [facility] for tasks performed    Extremity/Trunk Assessment Right Upper Extremity Assessment RUE ROM/Strength/Tone: [facility] for tasks assessed Left Upper Extremity Assessment LUE ROM/Strength/Tone: [facility] for tasks  assessed Right Lower Extremity Assessment RLE ROM/Strength/Tone: [facility] for tasks assessed Left Lower Extremity Assessment LLE ROM/Strength/Tone: [facility] for tasks assessed   Balance    End of Session PT - End of Session Activity Tolerance: Patient tolerated treatment well Patient left: in chair;with call bell/phone within reach;Other (comment) (OT in room) Nurse Communication: Mobility status  GP     Zimri Brennen 08/15/2012, 8:31 AM

## 2012-08-15 NOTE — Op Note (Signed)
NAMEJAZMENE, HULING NO.:  [id number]  MEDICAL RECORD NO.:  [id number]  LOCATION:  1602                         FACILITY:  [facility]  PHYSICIAN:  Georges Lynch. Neda Willenbring, M.D.DATE OF BIRTH:  Jan 09, 1966  DATE OF PROCEDURE:  08/14/2012 DATE OF DISCHARGE:                              OPERATIVE REPORT   SURGEON:  Georges Lynch. Darrelyn Hillock, M.D.  ASSISTANT:  Jene Every, M.D.  PREOPERATIVE DIAGNOSIS: 1. 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 2. Large herniated disk at L2-L3 that migrated caudalward distal iron     and impinging on the recess on the L3 roots.  She had all severe     left leg pain only preop.  POSTOPERATIVE DIAGNOSIS: 1. 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 2. Large herniated disk at L2-L3 that migrated caudalward distal iron     and impinging on the recess on the L3 roots.  She had all severe     left leg pain only preop.  PROCEDURE:  Under general anesthesia, the patient on spinal frame, routine orthopedic prep and draping of the lower back was carried out. Appropriate time-out was carried out first.  I also marked the appropriate left side of the back in the holding area.  At this time, 2 needles were placed in the back for localization purposes.  X-ray was taken.  We did take a series of x-rays for placement purposes.  At this time, an incision was made over L2-L3 in the usual fashion.  Bleeders identified and cauterized.  Prior to making the incision, I injected 20 mL of 0.25% Marcaine with epinephrine into the wound site.  Following that, another x-ray was taken with the instruments in place to verify our position.  At that time, we then carried out our central decompressive lumbar laminectomy, I removed a portion of the spinous processes of L2 and of L3.  We went down centrally, brought the microscope in, removed the ligamentum flavum, and exposed the dura.  At that time, the dura was protected with cottonoids.  We then went out to try  to preserve as much of that was possible.  We went out laterally and superiorly and inferiorly and then gently identified the nerve root. Also we did use the microscope.  After identifying the nerve root, we gently retracted the root.  There was a large herniated disk noted.  We put a needle in the space 1st to make sure that we were in the disk space and not in the nerve root.  At that time, longitudinal incision was made.  We did cauterize lateral recess veins with a bipolar. Following that, we utilized the nerve hook and literally removed several large pieces of disk that was subligamentous and migrated distally.  We went proximally, distally, and laterally as well and medially until the nerve root now was free.  Following that, the root and dura were free. We utilized a hockey-stick to probe the disk at the L2 root above and the L3 root below, so we exposed the isolated 2 roots to make sure they were both decompressed.  We thoroughly irrigated out the area, loosely applied some thrombin-soaked Gelfoam, and then closed the wound  layers in usual fashion.  We injected 20 mL of Exparel into the wound mixed with 10 mL of normal saline at the end of the procedure.  Note instruments were placed in the back and the superior instrument was just below the disk space, we had that well localized as well and we obviously were in the correct space because of the multiple disk fragments that we removed that were subligamentous.  The patient was given clindamycin 900 mg IV preop.          ______________________________ Georges Lynch. Darrelyn Hillock, M.D.     RAG/MEDQ  D:  08/14/2012  T:  08/15/2012  Job:  [id number]

## 2012-08-15 NOTE — Discharge Summary (Signed)
Physician Discharge Summary  Patient ID: Julie Hale MRN: [id number] DOB/AGE: 1966-02-07 46 y.o.  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 rate 16, height 5' 7.5" (1.715 m), weight 118.389 kg (261 lb), SpO2 96.00%. Neurologic: Grossly normal  Disposition: 01-Home or Self Care  Discharge Orders    Future Orders Please Complete By Expires   Call MD / Call 911      Comments:   If you experience chest pain or shortness of breath, CALL 911 and be transported to the hospital emergency room.  If you develope a fever above 101 F, pus (white drainage) or increased drainage or redness at the wound, or calf pain, call your surgeon's office.   Increase activity slowly as tolerated      Discharge instructions      Comments:   Change your dressing daily. Shower only, no tub bath. Call if any temperatures greater than 101 or any wound complications: [phone] during the day and ask for Dr. Jeannetta Ellis nurse, [facility].   Driving restrictions      Comments:   No driving for one weeks       Medication List     As of 08/15/2012  7:44 AM    STOP taking these medications         HYDROmorphone 4 MG tablet   Commonly known as: DILAUDID      predniSONE 10 MG tablet   Commonly known as: DELTASONE      TAKE these medications         levothyroxine 175 MCG tablet   Commonly known as: SYNTHROID, LEVOTHROID   Take 175 mcg by mouth every morning.      methocarbamol 500  MG tablet   Commonly known as: ROBAXIN   Take 1 tablet (500 mg total) by mouth every 6 (six) hours as needed. For muscle spasms      metoprolol succinate 50 MG 24 hr tablet   Commonly known as: TOPROL-XL   Take 50 mg by mouth every morning. Take with or immediately following a meal.      oxyCODONE-acetaminophen 5-325 MG per tablet   Commonly known as: PERCOCET/ROXICET   Take 1 tablet by mouth every 4 (four) hours as needed. For pain      PRESCRIPTION MEDICATION   Apply 1 application topically daily. Medication:Topical Testosterone         Signed: [name] 08/15/2012, 7:44 AM  Spinal Stenosis and Herniated Lumbar Disc

## 2012-08-15 NOTE — Care Management Note (Signed)
    Page 1 of 2   08/15/2012     11:17:03 AM   CARE MANAGEMENT NOTE 08/15/2012  Patient:  Julie Hale, Julie Hale   Account Number:  [id number]  Date Initiated:  08/15/2012  Documentation initiated by:  Colleen Can  Subjective/Objective Assessment:   dx spinal stenosis l2-l3; lumbar laminectomy/decompression micro-dissectomy     Action/Plan:   CM spoke with patient. Plans are for patient to return to her home in [place] where spouse will be caregiver. She needs RW . Wants raised toilet seat  if covered by insuranceNo HH services needed   Anticipated DC Date:  08/15/2012   Anticipated DC Plan:  HOME/SELF CARE  In-house referral  NA      DC Planning Services  CM consult      PAC Choice  DURABLE MEDICAL EQUIPMENT   Choice offered to / List presented to:  C-1 Patient   DME arranged  Levan Hurst      DME agency  [facility]     HH arranged  NA      HH agency  NA   Status of service:  Completed, signed off Medicare Important Message given?  NO (If response is "NO", the following Medicare IM given date fields will be blank) Date Medicare IM given:   Date Additional Medicare IM given:    Discharge Disposition:  HOME/SELF CARE  Per UR Regulation:    If discussed at Long Length of Stay Meetings, dates discussed:    Comments:

## 2012-08-15 NOTE — Progress Notes (Signed)
Subjective: Doing very well today. Leg pain is much better.   Objective: Vital signs in last 24 hours: Temp:  [97.8 F (36.6 C)-99.1 F (37.3 C)] 98.4 F (36.9 C) (10/04 0515) Pulse Rate:  [51-86] 65  (10/04 0515) Resp:  [8-19] 16  (10/04 0515) BP: (103-150)/(57-85) 103/67 mmHg (10/04 0515) SpO2:  [92 %-100 %] 96 % (10/04 0515) FiO2 (%):  [100 %] 100 % (10/03 1300) Weight:  [118.389 kg (261 lb)] 118.389 kg (261 lb) (10/03 1300)  Intake/Output from previous day: 10/03 0701 - 10/04 0700 In: 3294 [P.O.:360; I.V.:2834; IV Piggyback:100] Out: 4325 [Urine:4325] Intake/Output this shift:    No results found for this basename: HGB:5 in the last 72 hours No results found for this basename: WBC:2,RBC:2,HCT:2,PLT:2 in the last 72 hours No results found for this basename: NA:2,K:2,CL:2,CO2:2,BUN:2,CREATININE:2,GLUCOSE:2,CALCIUM:2 in the last 72 hours No results found for this basename: LABPT:2,INR:2 in the last 72 hours  Sensation intact distally Dorsiflexion/Plantar flexion intact  Assessment/Plan: Will DC today.Office in two weeks.   Julie Hale A 08/15/2012, 7:25 AM

## 2012-08-15 NOTE — Progress Notes (Signed)
D/C instructions reviewed w/ pt and SO. All questions answered, no further questions. Pt walker delivered and pt will borrow an elevated toilet seat from a friend. Pt d/c in w/c by NT. Pt in stable condition and in possession of d/c instructions, scripts, and all personal belongings. D/c to SO's car.

## 2012-09-11 ENCOUNTER — Ambulatory Visit: Payer: 59 | Attending: Orthopedic Surgery | Admitting: Physical Therapy

## 2012-09-11 DIAGNOSIS — M545 Low back pain, unspecified: Secondary | ICD-10-CM | POA: Insufficient documentation

## 2012-09-11 DIAGNOSIS — IMO0001 Reserved for inherently not codable concepts without codable children: Secondary | ICD-10-CM | POA: Insufficient documentation

## 2012-09-16 ENCOUNTER — Ambulatory Visit: Payer: 59 | Attending: Orthopedic Surgery | Admitting: Rehabilitation

## 2012-09-16 DIAGNOSIS — M545 Low back pain, unspecified: Secondary | ICD-10-CM | POA: Insufficient documentation

## 2012-09-16 DIAGNOSIS — IMO0001 Reserved for inherently not codable concepts without codable children: Secondary | ICD-10-CM | POA: Insufficient documentation

## 2012-09-18 ENCOUNTER — Ambulatory Visit: Payer: 59 | Admitting: Rehabilitation

## 2012-09-19 ENCOUNTER — Encounter: Payer: 59 | Admitting: Rehabilitation

## 2012-09-26 ENCOUNTER — Encounter: Payer: 59 | Admitting: Rehabilitation

## 2013-08-12 ENCOUNTER — Other Ambulatory Visit (HOSPITAL_COMMUNITY): Payer: Self-pay | Admitting: Gastroenterology

## 2013-08-12 DIAGNOSIS — R112 Nausea with vomiting, unspecified: Secondary | ICD-10-CM

## 2013-08-26 ENCOUNTER — Encounter (HOSPITAL_COMMUNITY): Payer: 59

## 2015-11-13 LAB — HM COLONOSCOPY

## 2018-01-08 ENCOUNTER — Encounter: Payer: Self-pay | Admitting: Primary Care

## 2018-01-08 ENCOUNTER — Ambulatory Visit (INDEPENDENT_AMBULATORY_CARE_PROVIDER_SITE_OTHER): Payer: BLUE CROSS/BLUE SHIELD | Admitting: Primary Care

## 2018-01-08 VITALS — BP 110/82 | HR 71 | Temp 98.1°F | Ht 66.75 in | Wt 204.5 lb

## 2018-01-08 DIAGNOSIS — M5106 Intervertebral disc disorders with myelopathy, lumbar region: Secondary | ICD-10-CM | POA: Diagnosis not present

## 2018-01-08 DIAGNOSIS — E039 Hypothyroidism, unspecified: Secondary | ICD-10-CM | POA: Diagnosis not present

## 2018-01-08 DIAGNOSIS — Z9884 Bariatric surgery status: Secondary | ICD-10-CM

## 2018-01-08 NOTE — Assessment & Plan Note (Signed)
Underwent sleeve gastrectomy in 2015. Repeat Vitamin D level today as she's not been taking. Compliant to B12 daily.

## 2018-01-08 NOTE — Patient Instructions (Signed)
Stop by the lab prior to leaving today. I will notify you of your results once received.   Please schedule a physical with me anytime in the future at your convenience. You may also schedule a lab only appointment 3-4 days prior. We will discuss your lab results in detail during your physical.  It was a pleasure to meet you today! Please don't hesitate to call or message me with any questions. Welcome to [facility]!

## 2018-01-08 NOTE — Progress Notes (Signed)
Subjective:    Patient ID: Julie Hale, female    DOB: [birth year], 52 y.o.   MRN: [id number]  HPI  [name] is a 52 year old female who presents today to establish care and discuss the problems mentioned below. Will obtain old records. Her last physical was about one year ago.   1) Hypothyroidism: Diagnosed 27 years ago. No history of hyperthyroidism. Currently managed on levothyroxine 100 mcg and has been at this dose for one year. Her last thyroid check was one year ago. She denies constipation, hair loss, fatigue.  2) Chronic Back Pain: History of back surgery with lumbar laminectomy to L4-5. She is currently following with a chiropractor and feels well managed.  3) Sleeve Gastrectomy: Underwent surgery in 2015. Currently managed on calcium and vitamin D, also on vitamin B 12. Once managed on vitamin D 50,000 unit capsules, has not recently been taking.  4) Essential Hypertension: History of years ago, improved with weight loss surgery. Was once on managed on medication (unsure which one) in the past.   5) Anxiety and Depression: History of in the past. Once managed on medication in the past but doesn't remember what medications she was taking. She does not currently follow with therapy but once did very regularly.   Review of Systems  Constitutional: Negative for fatigue.  Eyes: Negative for visual disturbance.  Respiratory: Negative for shortness of breath.   Cardiovascular: Negative for chest pain.  Musculoskeletal:       Chronic back pain  Neurological: Negative for dizziness and headaches.  Psychiatric/Behavioral: The patient is not nervous/anxious.        Past Medical History:  Diagnosis Date  . Anemia   . Arthritis   . Back pain, chronic   . Hypertension   . Hypothyroidism   . Thyroid disease      Social History   Socioeconomic History  . Marital status: Divorced    Spouse name: Not on file  . Number of children: Not on file  . Years of education: Not on  file  . Highest education level: Not on file  Social Needs  . Financial resource strain: Not on file  . Food insecurity - worry: Not on file  . Food insecurity - inability: Not on file  . Transportation needs - medical: Not on file  . Transportation needs - non-medical: Not on file  Occupational History  . Not on file  Tobacco Use  . Smoking status: Former Smoker    Packs/day: 2.00    Years: 5.00    Pack years: 10.00    Types: Cigarettes    Last attempt to quit: 11/12/2004    Years since quitting: 13.1  . Smokeless tobacco: Never Used  Substance and Sexual Activity  . Alcohol use: Yes    Comment: social only  . Drug use: No  . Sexual activity: Not on file  Other Topics Concern  . Not on file  Social History Narrative   Divorced.    2 children, 6 grandchildren.   Works as an Mudlogger.    Enjoys relaxing, spending time with grandchildren.     Past Surgical History:  Procedure Laterality Date  . ABDOMINAL HYSTERECTOMY  2006  . CHOLECYSTECTOMY  1990  . left hand thumb fusion  2012  . LUMBAR LAMINECTOMY/DECOMPRESSION MICRODISCECTOMY  08/14/2012   Procedure: LUMBAR LAMINECTOMY/DECOMPRESSION MICRODISCECTOMY;  Surgeon: Jacki Cones, MD;  Location: [facility];  Service: Orthopedics;  Laterality: Left;  lumbar 2- 3 left  .  TONSILLECTOMY  1969  . TUBAL LIGATION  1991  . TYMPANOSTOMY TUBE PLACEMENT  1975    Family History  Problem Relation Age of Onset  . Bipolar disorder Mother   . Hypertension Mother   . Obesity Mother   . Diabetes Mother   . Congestive Heart Failure Mother   . Heart disease Father   . Diabetes Father   . Stroke Father   . Heart attack Brother   . Pulmonary embolism Brother   . Obesity Brother     Allergies  Allergen Reactions  . Penicillins Hives and Rash  . Erythromycin Hives and Rash    Current Outpatient Medications on File Prior to Visit  Medication Sig Dispense Refill  . Calcium-Magnesium-Vitamin D 500-250-200 MG-MG-UNIT TABS  Take by mouth.    . levothyroxine (SYNTHROID, LEVOTHROID) 100 MCG tablet     . Multiple Vitamin (THERA) TABS Take by mouth.    . vitamin B-12 (CYANOCOBALAMIN) 1000 MCG tablet Take by mouth.     No current facility-administered medications on file prior to visit.     BP 110/82   Pulse 71   Temp 98.1 F (36.7 C) (Oral)   Ht 5' 6.75" (1.695 m)   Wt 204 lb 8 oz (92.8 kg)   SpO2 98%   BMI 32.27 kg/m    Objective:   Physical Exam  Constitutional: She appears well-nourished.  HENT:  Right Ear: Tympanic membrane and ear canal normal.  Left Ear: Tympanic membrane and ear canal normal.  Nose: Right sinus exhibits no maxillary sinus tenderness and no frontal sinus tenderness. Left sinus exhibits no maxillary sinus tenderness and no frontal sinus tenderness.  Mouth/Throat: Oropharynx is clear and moist.  Eyes: Conjunctivae are normal.  Neck: Neck supple. No thyromegaly present.  Cardiovascular: Normal rate and regular rhythm.  Pulmonary/Chest: Effort normal and breath sounds normal. She has no wheezes. She has no rales.  Skin: Skin is warm and dry.          Assessment & Plan:

## 2018-01-08 NOTE — Assessment & Plan Note (Signed)
Diagnosed 27 years ago. Repeat TSH pending today. Will send refills of levothyroxine once lab results received.

## 2018-01-08 NOTE — Assessment & Plan Note (Signed)
Underwent surgical intervention years ago, following with chiropractor regularly. Feels well managed.

## 2018-01-09 ENCOUNTER — Other Ambulatory Visit: Payer: Self-pay | Admitting: Primary Care

## 2018-01-09 DIAGNOSIS — E039 Hypothyroidism, unspecified: Secondary | ICD-10-CM

## 2018-01-09 LAB — VITAMIN D 25 HYDROXY (VIT D DEFICIENCY, FRACTURES): VITD: 39.6 ng/mL (ref 30.00–100.00)

## 2018-01-09 LAB — COMPREHENSIVE METABOLIC PANEL
ALBUMIN: 4 g/dL (ref 3.5–5.2)
ALK PHOS: 62 U/L (ref 39–117)
ALT: 13 U/L (ref 0–35)
AST: 18 U/L (ref 0–37)
BUN: 10 mg/dL (ref 6–23)
CALCIUM: 9.9 mg/dL (ref 8.4–10.5)
CO2: 29 mEq/L (ref 19–32)
CREATININE: 0.82 mg/dL (ref 0.40–1.20)
Chloride: 103 mEq/L (ref 96–112)
GFR: 77.64 mL/min (ref >60.00)
Glucose, Bld: 75 mg/dL (ref 70–99)
Potassium: 4.5 mEq/L (ref 3.5–5.1)
SODIUM: 140 meq/L (ref 135–145)
TOTAL PROTEIN: 7.3 g/dL (ref 6.0–8.3)
Total Bilirubin: 0.3 mg/dL (ref 0.2–1.2)

## 2018-01-09 LAB — TSH: TSH: 1.73 u[IU]/mL (ref 0.35–4.50)

## 2018-01-09 LAB — T4, FREE: Free T4: 0.9 ng/dL (ref 0.60–1.60)

## 2018-01-09 MED ORDER — LEVOTHYROXINE SODIUM 100 MCG PO TABS
ORAL_TABLET | ORAL | 3 refills | Status: DC
Start: 2018-01-09 — End: 2019-03-19

## 2018-01-10 ENCOUNTER — Telehealth: Payer: Self-pay

## 2018-01-10 NOTE — Telephone Encounter (Signed)
Copied from CRM [id number]. Topic: Inquiry >> Jan 10, 2018 11:01 AM Yvonna Alanisobinson, Andra M wrote: Reason for CRM: Patient called requesting lab results. Patient would like a call back this morning to [phone]. Patient states to leave a message if she does not answer.       Thank You!!!

## 2018-01-10 NOTE — Telephone Encounter (Signed)
Spoken and notified patient of Kate's comments. Patient verbalized understanding. 

## 2018-01-17 ENCOUNTER — Encounter: Payer: Self-pay | Admitting: Primary Care

## 2018-03-01 ENCOUNTER — Other Ambulatory Visit: Payer: Self-pay | Admitting: Primary Care

## 2018-03-01 DIAGNOSIS — Z0000 Encounter for general adult medical examination without abnormal findings: Secondary | ICD-10-CM

## 2018-03-01 DIAGNOSIS — Z9884 Bariatric surgery status: Secondary | ICD-10-CM

## 2018-03-03 ENCOUNTER — Other Ambulatory Visit (INDEPENDENT_AMBULATORY_CARE_PROVIDER_SITE_OTHER): Payer: BLUE CROSS/BLUE SHIELD

## 2018-03-03 DIAGNOSIS — Z0000 Encounter for general adult medical examination without abnormal findings: Secondary | ICD-10-CM

## 2018-03-03 DIAGNOSIS — Z9884 Bariatric surgery status: Secondary | ICD-10-CM

## 2018-03-03 LAB — CBC
HCT: 39.4 % (ref 36.0–46.0)
HEMOGLOBIN: 13.3 g/dL (ref 12.0–15.0)
MCHC: 33.8 g/dL (ref 30.0–36.0)
MCV: 96.4 fl (ref 78.0–100.0)
PLATELETS: 240 10*3/uL (ref 150.0–400.0)
RBC: 4.09 Mil/uL (ref 3.87–5.11)
RDW: 13.3 % (ref 11.5–15.5)
WBC: 7.6 10*3/uL (ref 4.0–10.5)

## 2018-03-03 LAB — LIPID PANEL
Cholesterol: 162 mg/dL (ref 0–200)
HDL: 66.5 mg/dL (ref >39.00)
LDL CALC: 82 mg/dL (ref 0–99)
NonHDL: 95.72
TRIGLYCERIDES: 68 mg/dL (ref 0.0–149.0)
Total CHOL/HDL Ratio: 2
VLDL: 13.6 mg/dL (ref 0.0–40.0)

## 2018-03-03 LAB — VITAMIN B12: Vitamin B-12: 488 pg/mL (ref 211–911)

## 2018-03-06 ENCOUNTER — Ambulatory Visit (INDEPENDENT_AMBULATORY_CARE_PROVIDER_SITE_OTHER): Payer: BLUE CROSS/BLUE SHIELD | Admitting: Primary Care

## 2018-03-06 ENCOUNTER — Encounter: Payer: Self-pay | Admitting: Primary Care

## 2018-03-06 VITALS — BP 114/80 | HR 75 | Temp 97.7°F | Ht 66.75 in | Wt 204.5 lb

## 2018-03-06 DIAGNOSIS — Z0000 Encounter for general adult medical examination without abnormal findings: Secondary | ICD-10-CM

## 2018-03-06 DIAGNOSIS — E039 Hypothyroidism, unspecified: Secondary | ICD-10-CM

## 2018-03-06 DIAGNOSIS — Z1231 Encounter for screening mammogram for malignant neoplasm of breast: Secondary | ICD-10-CM

## 2018-03-06 DIAGNOSIS — Z9884 Bariatric surgery status: Secondary | ICD-10-CM

## 2018-03-06 DIAGNOSIS — Z1239 Encounter for other screening for malignant neoplasm of breast: Secondary | ICD-10-CM

## 2018-03-06 NOTE — Assessment & Plan Note (Signed)
TSH stable from labs in February, continue levothyroxine 100 mcg.

## 2018-03-06 NOTE — Progress Notes (Signed)
Subjective:    Patient ID: Julie SlickerStena Hale, female    DOB: 1966/01/27, 52 y.o.   MRN: [id number]  HPI  Julie Hale is a 52 year old female who presents today for complete physical.  Immunizations: -Tetanus: Completed in 2012 -Influenza: Did not receive last season   Diet: She endorses a healthy diet. Breakfast: Protein shake, egg sandwich, frozen frittata  Lunch: Protein, fruit Dinner: Protein, fruit Snacks: Cheese, fruit, peanut butter and crackers Desserts: Chocolate, Daily Beverages: Water, Gatorade (low calorie), coffee  Exercise: She is not exercising  Eye exam: Completed several years ago Dental exam: No recent exam Colonoscopy: Completed in 2017,  Pap Smear: Hysterectomy Mammogram: Completed in 2017  Wt Readings from Last 3 Encounters:  03/06/18 204 lb 8 oz (92.8 kg)  01/08/18 204 lb 8 oz (92.8 kg)  08/14/12 261 lb (118.4 kg)      Review of Systems  Constitutional: Negative for unexpected weight change.  HENT: Negative for rhinorrhea.   Respiratory: Negative for cough and shortness of breath.   Cardiovascular: Negative for chest pain.  Gastrointestinal: Negative for constipation and diarrhea.  Genitourinary: Negative for difficulty urinating and menstrual problem.  Musculoskeletal: Negative for arthralgias and myalgias.  Skin: Negative for rash.  Allergic/Immunologic: Negative for environmental allergies.  Neurological: Negative for dizziness, numbness and headaches.  Psychiatric/Behavioral:       Meeting with therapist, recently underwent divorce       Past Medical History:  Diagnosis Date  . Anemia   . Anxiety and depression   . Arthritis   . Back pain, chronic   . Hypertension   . Hypothyroidism      Social History   Socioeconomic History  . Marital status: Divorced    Spouse name: Not on file  . Number of children: Not on file  . Years of education: Not on file  . Highest education level: Not on file  Occupational History  . Not on file    Social Needs  . Financial resource strain: Not on file  . Food insecurity:    Worry: Not on file    Inability: Not on file  . Transportation needs:    Medical: Not on file    Non-medical: Not on file  Tobacco Use  . Smoking status: Former Smoker    Packs/day: 2.00    Years: 5.00    Pack years: 10.00    Types: Cigarettes    Last attempt to quit: 11/12/2004    Years since quitting: 13.3  . Smokeless tobacco: Never Used  Substance and Sexual Activity  . Alcohol use: Yes    Comment: social only  . Drug use: No  . Sexual activity: Not on file  Lifestyle  . Physical activity:    Days per week: Not on file    Minutes per session: Not on file  . Stress: Not on file  Relationships  . Social connections:    Talks on phone: Not on file    Gets together: Not on file    Attends religious service: Not on file    Active member of club or organization: Not on file    Attends meetings of clubs or organizations: Not on file    Relationship status: Not on file  . Intimate partner violence:    Fear of current or ex partner: Not on file    Emotionally abused: Not on file    Physically abused: Not on file    Forced sexual activity: Not on file  Other Topics Concern  . Not on file  Social History Narrative   Divorced.    2 children, 6 grandchildren.   Works as an Mudlogger.    Enjoys relaxing, spending time with grandchildren.     Past Surgical History:  Procedure Laterality Date  . ABDOMINAL HYSTERECTOMY  2006  . CHOLECYSTECTOMY  1990  . left hand thumb fusion  2012  . LUMBAR LAMINECTOMY/DECOMPRESSION MICRODISCECTOMY  08/14/2012   Procedure: LUMBAR LAMINECTOMY/DECOMPRESSION MICRODISCECTOMY;  Surgeon: Jacki Cones, MD;  Location: [facility];  Service: Orthopedics;  Laterality: Left;  lumbar 2- 3 left  . TONSILLECTOMY  1969  . TUBAL LIGATION  1991  . TYMPANOSTOMY TUBE PLACEMENT  1975    Family History  Problem Relation Age of Onset  . Bipolar disorder Mother   .  Hypertension Mother   . Obesity Mother   . Diabetes Mother   . Congestive Heart Failure Mother   . Heart disease Father   . Diabetes Father   . Stroke Father   . Heart attack Brother   . Pulmonary embolism Brother   . Obesity Brother     Allergies  Allergen Reactions  . Penicillins Hives and Rash  . Erythromycin Hives and Rash    Current Outpatient Medications on File Prior to Visit  Medication Sig Dispense Refill  . Calcium-Magnesium-Vitamin D 500-250-200 MG-MG-UNIT TABS Take by mouth.    . levothyroxine (SYNTHROID, LEVOTHROID) 100 MCG tablet Take 1 tablet by mouth every morning on an empty stomach with a full glass of water.  Do not eat within 30 minutes. 90 tablet 3  . Multiple Vitamin (THERA) TABS Take by mouth.    . vitamin B-12 (CYANOCOBALAMIN) 1000 MCG tablet Take by mouth.     No current facility-administered medications on file prior to visit.     BP 114/80   Pulse 75   Temp 97.7 F (36.5 C) (Oral)   Ht 5' 6.75" (1.695 m)   Wt 204 lb 8 oz (92.8 kg)   SpO2 97%   BMI 32.27 kg/m    Objective:   Physical Exam  Constitutional: She is oriented to person, place, and time. She appears well-nourished.  HENT:  Right Ear: Tympanic membrane and ear canal normal.  Left Ear: Tympanic membrane and ear canal normal.  Nose: Nose normal.  Mouth/Throat: Oropharynx is clear and moist.  Eyes: Pupils are equal, round, and reactive to light. Conjunctivae and EOM are normal.  Neck: Neck supple. No thyromegaly present.  Cardiovascular: Normal rate and regular rhythm.  No murmur heard. Pulmonary/Chest: Effort normal and breath sounds normal. She has no rales.  Abdominal: Soft. Bowel sounds are normal. There is no tenderness.  Musculoskeletal: Normal range of motion.  Lymphadenopathy:    She has no cervical adenopathy.  Neurological: She is alert and oriented to person, place, and time. She has normal reflexes. No cranial nerve deficit.  Skin: Skin is warm and dry. No rash  noted.  Psychiatric: She has a normal mood and affect.          Assessment & Plan:

## 2018-03-06 NOTE — Assessment & Plan Note (Signed)
Immunizations UTD. Colonoscopy UTD. Mammogram due, orders placed.  Commended her on improvements in diet, recommended to increase vegetables and to start exercise.  Exam unremarkable. Labs unremarkable. Follow up in 1 year for CPE.

## 2018-03-06 NOTE — Patient Instructions (Addendum)
Start exercising. You should be getting 150 minutes of moderate intensity exercise weekly.  Continue to work on a healthy diet. Increase vegetables, fruit, whole grains, lean protein.  Call the [facility] to schedule your mammogram.   Follow up in 1 year for your annual exam or sooner if needed.  It was a pleasure to see you today!   Preventive Care 40-64 Years, Female Preventive care refers to lifestyle choices and visits with your health care provider that can promote health and wellness. What does preventive care include?  A yearly physical exam. This is also called an annual well check.  Dental exams once or twice a year.  Routine eye exams. Ask your health care provider how often you should have your eyes checked.  Personal lifestyle choices, including: ? Daily care of your teeth and gums. ? Regular physical activity. ? Eating a healthy diet. ? Avoiding tobacco and drug use. ? Limiting alcohol use. ? Practicing safe sex. ? Taking low-dose aspirin daily starting at age 14. ? Taking vitamin and mineral supplements as recommended by your health care provider. What happens during an annual well check? The services and screenings done by your health care provider during your annual well check will depend on your age, overall health, lifestyle risk factors, and family history of disease. Counseling Your health care provider may ask you questions about your:  Alcohol use.  Tobacco use.  Drug use.  Emotional well-being.  Home and relationship well-being.  Sexual activity.  Eating habits.  Work and work Statistician.  Method of birth control.  Menstrual cycle.  Pregnancy history.  Screening You may have the following tests or measurements:  Height, weight, and BMI.  Blood pressure.  Lipid and cholesterol levels. These may be checked every 5 years, or more frequently if you are over 31 years old.  Skin check.  Lung cancer screening. You may  have this screening every year starting at age 93 if you have a 30-pack-year history of smoking and currently smoke or have quit within the past 15 years.  Fecal occult blood test (FOBT) of the stool. You may have this test every year starting at age 68.  Flexible sigmoidoscopy or colonoscopy. You may have a sigmoidoscopy every 5 years or a colonoscopy every 10 years starting at age 66.  Hepatitis C blood test.  Hepatitis B blood test.  Sexually transmitted disease (STD) testing.  Diabetes screening. This is done by checking your blood sugar (glucose) after you have not eaten for a while (fasting). You may have this done every 1-3 years.  Mammogram. This may be done every 1-2 years. Talk to your health care provider about when you should start having regular mammograms. This may depend on whether you have a family history of breast cancer.  BRCA-related cancer screening. This may be done if you have a family history of breast, ovarian, tubal, or peritoneal cancers.  Pelvic exam and Pap test. This may be done every 3 years starting at age 33. Starting at age 31, this may be done every 5 years if you have a Pap test in combination with an HPV test.  Bone density scan. This is done to screen for osteoporosis. You may have this scan if you are at high risk for osteoporosis.  Discuss your test results, treatment options, and if necessary, the need for more tests with your health care provider. Vaccines Your health care provider may recommend certain vaccines, such as:  Influenza vaccine. This is recommended  every year.  Tetanus, diphtheria, and acellular pertussis (Tdap, Td) vaccine. You may need a Td booster every 10 years.  Varicella vaccine. You may need this if you have not been vaccinated.  Zoster vaccine. You may need this after age 57.  Measles, mumps, and rubella (MMR) vaccine. You may need at least one dose of MMR if you were born in 1957 or later. You may also need a second  dose.  Pneumococcal 13-valent conjugate (PCV13) vaccine. You may need this if you have certain conditions and were not previously vaccinated.  Pneumococcal polysaccharide (PPSV23) vaccine. You may need one or two doses if you smoke cigarettes or if you have certain conditions.  Meningococcal vaccine. You may need this if you have certain conditions.  Hepatitis A vaccine. You may need this if you have certain conditions or if you travel or work in places where you may be exposed to hepatitis A.  Hepatitis B vaccine. You may need this if you have certain conditions or if you travel or work in places where you may be exposed to hepatitis B.  Haemophilus influenzae type b (Hib) vaccine. You may need this if you have certain conditions.  Talk to your health care provider about which screenings and vaccines you need and how often you need them. This information is not intended to replace advice given to you by your health care provider. Make sure you discuss any questions you have with your health care provider. Document Released: 11/25/2015 Document Revised: 07/18/2016 Document Reviewed: 08/30/2015 Elsevier Interactive Patient Education  [employer].

## 2018-03-06 NOTE — Assessment & Plan Note (Signed)
Vitamin B 12, CBC, CMP, TSH stable.

## 2018-04-24 ENCOUNTER — Ambulatory Visit: Payer: Self-pay

## 2018-04-24 NOTE — Telephone Encounter (Signed)
Pt made appt with agent then call was transferred to NT for further assessment regarding the hematuria.  Pt stated yesterday when she wiped after voiding she noted light pink color on toilet paper and then later in the day yesterday, her urine turned cola color. No frank blood was noted. She began increasing her fluid intake and drinking cranberry juice. Today she has not noted any blood and urine is back to normal yellow color. Pt states she has less urgency. Pt complains that she feels less pressure as she is emptying her bladder but no burning. Pt stated she is nauseated but is tolerating clear and full liquid diet as well as crackers. Pt denies back or flank pain, fever or abdominal pain. Care advice given and pt to go to appt tomorrow am as previously scheduled. Reason for Disposition . Blood in urine  (Exception: could be normal menstrual bleeding)  Answer Assessment - Initial Assessment Questions 1. COLOR of URINE: "Describe the color of the urine."  (e.g., tea-colored, pink, red, blood clots, bloody)     Lt pink to turned cola color yesterday- not today.  2. ONSET: "When did the bleeding start?"      yesterday 3. EPISODES: "How many times has there been blood in the urine?" or "How many times today?"     Has not noticed it today. Yesterday 5-6 times 4. PAIN with URINATION: "Is there any pain with passing your urine?" If so, ask: "How bad is the pain?"  (Scale 1-10; or mild, moderate, severe)    - MILD - complains slightly about urination hurting    - MODERATE - interferes with normal activities      - SEVERE - excruciating, unwilling or unable to urinate because of the pain      mild 5. FEVER: "Do you have a fever?" If so, ask: "What is your temperature, how was it measured, and when did it start?"     Having chills 6. ASSOCIATED SYMPTOMS: "Are you passing urine more frequently than usual?"     Not passing urine as frequently today as yesterday. 7. OTHER SYMPTOMS: "Do you have any other  symptoms?" (e.g., back/flank pain, abdominal pain, vomiting)     Nauseated, feeling pressure as bladder emptying 8. PREGNANCY: "Is there any chance you are pregnant?" "When was your last menstrual period?"     N/a - n/a  Protocols used: URINE - BLOOD IN-A-AH

## 2018-04-25 ENCOUNTER — Encounter: Payer: Self-pay | Admitting: Primary Care

## 2018-04-25 ENCOUNTER — Ambulatory Visit (INDEPENDENT_AMBULATORY_CARE_PROVIDER_SITE_OTHER): Payer: BLUE CROSS/BLUE SHIELD | Admitting: Primary Care

## 2018-04-25 VITALS — BP 116/80 | HR 83 | Temp 97.9°F | Ht 66.75 in | Wt 210.8 lb

## 2018-04-25 DIAGNOSIS — R350 Frequency of micturition: Secondary | ICD-10-CM

## 2018-04-25 LAB — POC URINALSYSI DIPSTICK (AUTOMATED)
Bilirubin, UA: NEGATIVE
GLUCOSE UA: NEGATIVE
Ketones, UA: NEGATIVE
LEUKOCYTES UA: NEGATIVE
NITRITE UA: NEGATIVE
Protein, UA: NEGATIVE
RBC UA: NEGATIVE
Spec Grav, UA: 1.015 (ref 1.010–1.025)
UROBILINOGEN UA: 0.2 U/dL
pH, UA: 7 (ref 5.0–8.0)

## 2018-04-25 LAB — CBC WITH DIFFERENTIAL/PLATELET
BASOS ABS: 0.1 10*3/uL (ref 0.0–0.1)
Basophils Relative: 0.7 % (ref 0.0–3.0)
Eosinophils Absolute: 0.2 10*3/uL (ref 0.0–0.7)
Eosinophils Relative: 2.9 % (ref 0.0–5.0)
HEMATOCRIT: 39.3 % (ref 36.0–46.0)
HEMOGLOBIN: 13.3 g/dL (ref 12.0–15.0)
LYMPHS PCT: 26.9 % (ref 12.0–46.0)
Lymphs Abs: 1.9 10*3/uL (ref 0.7–4.0)
MCHC: 33.8 g/dL (ref 30.0–36.0)
MCV: 96.9 fl (ref 78.0–100.0)
MONOS PCT: 6.7 % (ref 3.0–12.0)
Monocytes Absolute: 0.5 10*3/uL (ref 0.1–1.0)
Neutro Abs: 4.4 10*3/uL (ref 1.4–7.7)
Neutrophils Relative %: 62.8 % (ref 43.0–77.0)
Platelets: 233 10*3/uL (ref 150.0–400.0)
RBC: 4.06 Mil/uL (ref 3.87–5.11)
RDW: 13.7 % (ref 11.5–15.5)
WBC: 7.1 10*3/uL (ref 4.0–10.5)

## 2018-04-25 LAB — BASIC METABOLIC PANEL
BUN: 6 mg/dL (ref 6–23)
CALCIUM: 8.9 mg/dL (ref 8.4–10.5)
CO2: 29 meq/L (ref 19–32)
CREATININE: 0.77 mg/dL (ref 0.40–1.20)
Chloride: 106 mEq/L (ref 96–112)
GFR: 83.4 mL/min (ref >60.00)
Glucose, Bld: 73 mg/dL (ref 70–99)
Potassium: 3.6 mEq/L (ref 3.5–5.1)
SODIUM: 141 meq/L (ref 135–145)

## 2018-04-25 NOTE — Progress Notes (Signed)
Subjective:    Patient ID: Julie Hale, female    DOB: 1966/01/01, 52 y.o.   MRN: [id number]  HPI  [name] is a 52 year old female who presents today with a chief complaint of urinary frequency.  She also reports hematuria, pelvic pressure, chills, headaches. Her symptoms began 3 days ago. She's been increasing her water intake and drinking cran-cherry juice. Today she's noticed some improvement to her frequency and hematuria.   She denies fevers, vaginal symptoms, abdominal pain, flank pain, vomiting.   Review of Systems  Constitutional: Positive for chills and fatigue. Negative for fever.  Gastrointestinal: Positive for nausea. Negative for abdominal pain, constipation, diarrhea and vomiting.  Genitourinary: Positive for frequency and hematuria. Negative for dysuria and flank pain.  Neurological: Positive for headaches.       Past Medical History:  Diagnosis Date  . Anemia   . Anxiety and depression   . Arthritis   . Back pain, chronic   . Hypertension   . Hypothyroidism      Social History   Socioeconomic History  . Marital status: Divorced    Spouse name: Not on file  . Number of children: Not on file  . Years of education: Not on file  . Highest education level: Not on file  Occupational History  . Not on file  Social Needs  . Financial resource strain: Not on file  . Food insecurity:    Worry: Not on file    Inability: Not on file  . Transportation needs:    Medical: Not on file    Non-medical: Not on file  Tobacco Use  . Smoking status: Former Smoker    Packs/day: 2.00    Years: 5.00    Pack years: 10.00    Types: Cigarettes    Last attempt to quit: 11/12/2004    Years since quitting: 13.4  . Smokeless tobacco: Never Used  Substance and Sexual Activity  . Alcohol use: Yes    Comment: social only  . Drug use: No  . Sexual activity: Not on file  Lifestyle  . Physical activity:    Days per week: Not on file    Minutes per session: Not on file    . Stress: Not on file  Relationships  . Social connections:    Talks on phone: Not on file    Gets together: Not on file    Attends religious service: Not on file    Active member of club or organization: Not on file    Attends meetings of clubs or organizations: Not on file    Relationship status: Not on file  . Intimate partner violence:    Fear of current or ex partner: Not on file    Emotionally abused: Not on file    Physically abused: Not on file    Forced sexual activity: Not on file  Other Topics Concern  . Not on file  Social History Narrative   Divorced.    2 children, 6 grandchildren.   Works as an Mudloggeroperations analyst.    Enjoys relaxing, spending time with grandchildren.     Past Surgical History:  Procedure Laterality Date  . ABDOMINAL HYSTERECTOMY  2006  . CHOLECYSTECTOMY  1990  . left hand thumb fusion  2012  . LUMBAR LAMINECTOMY/DECOMPRESSION MICRODISCECTOMY  08/14/2012   Procedure: LUMBAR LAMINECTOMY/DECOMPRESSION MICRODISCECTOMY;  Surgeon: Jacki Conesonald A Gioffre, MD;  Location: [facility];  Service: Orthopedics;  Laterality: Left;  lumbar 2- 3 left  .  TONSILLECTOMY  1969  . TUBAL LIGATION  1991  . TYMPANOSTOMY TUBE PLACEMENT  1975    Family History  Problem Relation Age of Onset  . Bipolar disorder Mother   . Hypertension Mother   . Obesity Mother   . Diabetes Mother   . Congestive Heart Failure Mother   . Heart disease Father   . Diabetes Father   . Stroke Father   . Heart attack Brother   . Pulmonary embolism Brother   . Obesity Brother     Allergies  Allergen Reactions  . Penicillins Hives and Rash  . Erythromycin Hives and Rash    Current Outpatient Medications on File Prior to Visit  Medication Sig Dispense Refill  . Calcium-Magnesium-Vitamin D 500-250-200 MG-MG-UNIT TABS Take by mouth.    . levothyroxine (SYNTHROID, LEVOTHROID) 100 MCG tablet Take 1 tablet by mouth every morning on an empty stomach with a full glass of water.  Do not eat within  30 minutes. 90 tablet 3  . Multiple Vitamin (THERA) TABS Take by mouth.    . vitamin B-12 (CYANOCOBALAMIN) 1000 MCG tablet Take by mouth.     No current facility-administered medications on file prior to visit.     BP 116/80   Pulse 83   Temp 97.9 F (36.6 C) (Oral)   Ht 5' 6.75" (1.695 m)   Wt 210 lb 12 oz (95.6 kg)   SpO2 98%   BMI 33.26 kg/m    Objective:   Physical Exam  Constitutional: She appears well-nourished.  Neck: Neck supple.  Cardiovascular: Normal rate.  Respiratory: Effort normal.  GI: Soft. Normal appearance and bowel sounds are normal. There is no tenderness. There is no CVA tenderness.    Suprapubic pressure  Skin: Skin is warm and dry.           Assessment & Plan:  Urinary Frequency:  Also with "hematuria", fatigue, chills. No abdominal or vaginal symptoms. Exam today overall unremarkable, doesn't appear sickly, she is not in distress. UA today negative. No blood, leuks, glucose, nitrites, etc. Will send culture to ensure. Check CBC and BMP for renal function and to ensure no underlying infection. Doubt renal stone, acute pyelonephritis. Could be interstitial cystitis, bladder prolapse. She will update if symptoms do not continue to improve.  Doreene Nest, NP

## 2018-04-25 NOTE — Patient Instructions (Signed)
Stop by the lab prior to leaving today. I will notify you of your results once received.   Your urine does not show evidence of infection today, I'll be in touch once I receive your urine culture results.  Ensure you are consuming 64 ounces of water daily.  It was a pleasure to see you today!

## 2018-04-26 ENCOUNTER — Encounter: Payer: Self-pay | Admitting: Primary Care

## 2018-04-26 DIAGNOSIS — Z113 Encounter for screening for infections with a predominantly sexual mode of transmission: Secondary | ICD-10-CM

## 2018-04-27 LAB — URINE CULTURE
MICRO NUMBER:: 90716105
SPECIMEN QUALITY:: ADEQUATE

## 2018-04-28 ENCOUNTER — Telehealth: Payer: Self-pay | Admitting: Primary Care

## 2018-04-28 ENCOUNTER — Other Ambulatory Visit: Payer: Self-pay | Admitting: Primary Care

## 2018-04-28 DIAGNOSIS — N3000 Acute cystitis without hematuria: Secondary | ICD-10-CM

## 2018-04-28 MED ORDER — CIPROFLOXACIN HCL 500 MG PO TABS: 500.0000 mg | ORAL_TABLET | Freq: Two times a day (BID) | ORAL | 0 refills | Status: DC

## 2018-04-28 NOTE — Telephone Encounter (Signed)
Spoken to patient. Explain to patient that her blood work look normal on Friday. The urine culture results show that she has infection. I did explain to patient about both results.

## 2018-04-28 NOTE — Telephone Encounter (Signed)
Copied from CRM [id number]. Topic: Quick Communication - Lab Results >> Apr 28, 2018  8:36 AM Julie Hale, Julie Hale wrote: Patient called and said that she got a call back for her lab results on Friday from Vernona RiegerKatherine Clark CMA and told her everything was normal but this morning she got a message from [facility] and said that her urine analysis was abnormal and would like clarification on this. Please call pt back ,thanks.

## 2018-05-06 ENCOUNTER — Other Ambulatory Visit (INDEPENDENT_AMBULATORY_CARE_PROVIDER_SITE_OTHER): Payer: BLUE CROSS/BLUE SHIELD

## 2018-05-06 DIAGNOSIS — Z113 Encounter for screening for infections with a predominantly sexual mode of transmission: Secondary | ICD-10-CM | POA: Diagnosis not present

## 2018-05-06 NOTE — Addendum Note (Signed)
Addended by: Alvina ChouWALSH, Toshio Slusher J on: 05/06/2018 10:01 AM   Modules accepted: Orders

## 2018-05-07 ENCOUNTER — Encounter: Payer: Self-pay | Admitting: Primary Care

## 2018-05-07 LAB — C. TRACHOMATIS/N. GONORRHOEAE RNA
C. TRACHOMATIS RNA, TMA: NOT DETECTED
N. GONORRHOEAE RNA, TMA: NOT DETECTED

## 2018-05-09 LAB — HEPATITIS C ANTIBODY
Hepatitis C Ab: NONREACTIVE
SIGNAL TO CUT-OFF: 0.02 (ref <1.00)

## 2018-05-09 LAB — HSV 1/2 AB (IGM), IFA W/RFLX TITER
HSV 1 IGM SCREEN: NEGATIVE
HSV 2 IgM Screen: NEGATIVE

## 2018-05-09 LAB — HIV ANTIBODY (ROUTINE TESTING W REFLEX): HIV: NONREACTIVE

## 2018-05-09 LAB — HSV(HERPES SIMPLEX VRS) I + II AB-IGG
HSV 1 IGG, TYPE SPEC: 17.1 {index} — AB
HSV 2 IGG,TYPE SPECIFIC AB: 1.51 index — ABNORMAL HIGH

## 2018-05-09 LAB — RPR: RPR: NONREACTIVE

## 2018-05-15 ENCOUNTER — Encounter: Payer: Self-pay | Admitting: Primary Care

## 2019-02-10 ENCOUNTER — Emergency Department: Payer: BLUE CROSS/BLUE SHIELD

## 2019-02-10 ENCOUNTER — Observation Stay
Admission: EM | Admit: 2019-02-10 | Discharge: 2019-02-10 | Disposition: A | Discharge status: Home / Self Care | Payer: BLUE CROSS/BLUE SHIELD | Attending: Internal Medicine | Admitting: Internal Medicine

## 2019-02-10 ENCOUNTER — Other Ambulatory Visit: Payer: Self-pay

## 2019-02-10 ENCOUNTER — Telehealth: Payer: Self-pay | Admitting: Primary Care

## 2019-02-10 DIAGNOSIS — F419 Anxiety disorder, unspecified: Secondary | ICD-10-CM | POA: Diagnosis not present

## 2019-02-10 DIAGNOSIS — M549 Dorsalgia, unspecified: Secondary | ICD-10-CM | POA: Diagnosis present

## 2019-02-10 DIAGNOSIS — M5126 Other intervertebral disc displacement, lumbar region: Secondary | ICD-10-CM | POA: Diagnosis not present

## 2019-02-10 DIAGNOSIS — M545 Low back pain: Secondary | ICD-10-CM | POA: Diagnosis not present

## 2019-02-10 DIAGNOSIS — Z8249 Family history of ischemic heart disease and other diseases of the circulatory system: Secondary | ICD-10-CM | POA: Diagnosis not present

## 2019-02-10 DIAGNOSIS — Z79890 Hormone replacement therapy (postmenopausal): Secondary | ICD-10-CM | POA: Diagnosis not present

## 2019-02-10 DIAGNOSIS — M5441 Lumbago with sciatica, right side: Secondary | ICD-10-CM | POA: Diagnosis not present

## 2019-02-10 DIAGNOSIS — I10 Essential (primary) hypertension: Secondary | ICD-10-CM | POA: Insufficient documentation

## 2019-02-10 DIAGNOSIS — M5442 Lumbago with sciatica, left side: Secondary | ICD-10-CM | POA: Diagnosis not present

## 2019-02-10 DIAGNOSIS — M5489 Other dorsalgia: Secondary | ICD-10-CM | POA: Diagnosis not present

## 2019-02-10 DIAGNOSIS — G8929 Other chronic pain: Principal | ICD-10-CM | POA: Insufficient documentation

## 2019-02-10 DIAGNOSIS — M5416 Radiculopathy, lumbar region: Secondary | ICD-10-CM | POA: Diagnosis not present

## 2019-02-10 DIAGNOSIS — Z79899 Other long term (current) drug therapy: Secondary | ICD-10-CM | POA: Insufficient documentation

## 2019-02-10 DIAGNOSIS — M1990 Unspecified osteoarthritis, unspecified site: Secondary | ICD-10-CM | POA: Insufficient documentation

## 2019-02-10 DIAGNOSIS — R55 Syncope and collapse: Secondary | ICD-10-CM

## 2019-02-10 DIAGNOSIS — Z87891 Personal history of nicotine dependence: Secondary | ICD-10-CM | POA: Diagnosis not present

## 2019-02-10 DIAGNOSIS — M48061 Spinal stenosis, lumbar region without neurogenic claudication: Secondary | ICD-10-CM | POA: Diagnosis not present

## 2019-02-10 DIAGNOSIS — E039 Hypothyroidism, unspecified: Secondary | ICD-10-CM | POA: Insufficient documentation

## 2019-02-10 DIAGNOSIS — R2681 Unsteadiness on feet: Secondary | ICD-10-CM | POA: Diagnosis not present

## 2019-02-10 DIAGNOSIS — F329 Major depressive disorder, single episode, unspecified: Secondary | ICD-10-CM | POA: Insufficient documentation

## 2019-02-10 DIAGNOSIS — R52 Pain, unspecified: Secondary | ICD-10-CM | POA: Diagnosis not present

## 2019-02-10 DIAGNOSIS — R202 Paresthesia of skin: Secondary | ICD-10-CM | POA: Diagnosis not present

## 2019-02-10 LAB — URINALYSIS, COMPLETE (UACMP) WITH MICROSCOPIC
Bacteria, UA: NONE SEEN
Bilirubin Urine: NEGATIVE
Glucose, UA: NEGATIVE mg/dL
Hgb urine dipstick: NEGATIVE
Ketones, ur: NEGATIVE mg/dL
Leukocytes,Ua: NEGATIVE
Nitrite: NEGATIVE
Protein, ur: NEGATIVE mg/dL
Specific Gravity, Urine: 1.013 (ref 1.005–1.030)
pH: 7 (ref 5.0–8.0)

## 2019-02-10 LAB — BASIC METABOLIC PANEL
Anion gap: 12 (ref 5–15)
BUN: 10 mg/dL (ref 6–20)
CO2: 21 mmol/L — ABNORMAL LOW (ref 22–32)
Calcium: 9.1 mg/dL (ref 8.9–10.3)
Chloride: 107 mmol/L (ref 98–111)
Creatinine, Ser: 0.8 mg/dL (ref 0.44–1.00)
GFR calc Af Amer: >60 mL/min (ref >60)
GFR calc non Af Amer: >60 mL/min (ref >60)
Glucose, Bld: 108 mg/dL — ABNORMAL HIGH (ref 70–99)
Potassium: 3.5 mmol/L (ref 3.5–5.1)
Sodium: 140 mmol/L (ref 135–145)

## 2019-02-10 LAB — CBC WITH DIFFERENTIAL/PLATELET
Abs Immature Granulocytes: 0.02 10*3/uL (ref 0.00–0.07)
Basophils Absolute: 0.1 10*3/uL (ref 0.0–0.1)
Basophils Relative: 1 %
EOS PCT: 2 %
Eosinophils Absolute: 0.2 10*3/uL (ref 0.0–0.5)
HCT: 40.5 % (ref 36.0–46.0)
Hemoglobin: 13.4 g/dL (ref 12.0–15.0)
Immature Granulocytes: 0 %
Lymphocytes Relative: 36 %
Lymphs Abs: 3.3 10*3/uL (ref 0.7–4.0)
MCH: 32.2 pg (ref 26.0–34.0)
MCHC: 33.1 g/dL (ref 30.0–36.0)
MCV: 97.4 fL (ref 80.0–100.0)
Monocytes Absolute: 0.7 10*3/uL (ref 0.1–1.0)
Monocytes Relative: 8 %
Neutro Abs: 4.9 10*3/uL (ref 1.7–7.7)
Neutrophils Relative %: 53 %
Platelets: 252 10*3/uL (ref 150–400)
RBC: 4.16 MIL/uL (ref 3.87–5.11)
RDW: 12.8 % (ref 11.5–15.5)
WBC: 9.3 10*3/uL (ref 4.0–10.5)
nRBC: 0 % (ref 0.0–0.2)

## 2019-02-10 LAB — TROPONIN I: Troponin I: <0.03 ng/mL (ref <0.03)

## 2019-02-10 MED ORDER — KETOROLAC TROMETHAMINE 30 MG/ML IJ SOLN
15.0000 mg | Freq: Once | INTRAMUSCULAR | Status: AC
  Administered 2019-02-10: 15 mg via INTRAVENOUS
  Filled 2019-02-10: qty 1

## 2019-02-10 MED ORDER — DIAZEPAM 2 MG PO TABS
2.0000 mg | ORAL_TABLET | Freq: Once | ORAL | Status: AC
  Administered 2019-02-10: 2 mg via ORAL
  Filled 2019-02-10: qty 1

## 2019-02-10 MED ORDER — DOCUSATE SODIUM 100 MG PO CAPS: 100.0000 mg | ORAL_CAPSULE | Freq: Two times a day (BID) | ORAL | 0 refills | Status: AC

## 2019-02-10 MED ORDER — DOCUSATE SODIUM 100 MG PO CAPS
100.0000 mg | ORAL_CAPSULE | Freq: Two times a day (BID) | ORAL | Status: DC
  Administered 2019-02-10: 100 mg via ORAL
  Filled 2019-02-10: qty 1

## 2019-02-10 MED ORDER — ENOXAPARIN SODIUM 40 MG/0.4ML ~~LOC~~ SOLN
40.0000 mg | SUBCUTANEOUS | Status: DC
  Administered 2019-02-10: 40 mg via SUBCUTANEOUS
  Filled 2019-02-10: qty 0.4

## 2019-02-10 MED ORDER — SODIUM CHLORIDE 0.9 % IV BOLUS
1000.0000 mL | Freq: Once | INTRAVENOUS | Status: AC
  Administered 2019-02-10: 1000 mL via INTRAVENOUS

## 2019-02-10 MED ORDER — CYCLOBENZAPRINE HCL 10 MG PO TABS: 10.0000 mg | ORAL_TABLET | Freq: Three times a day (TID) | ORAL | 0 refills | Status: DC | PRN

## 2019-02-10 MED ORDER — TRAZODONE HCL 50 MG PO TABS: 25.0000 mg | ORAL_TABLET | Freq: Every evening | ORAL | Status: DC | PRN

## 2019-02-10 MED ORDER — ONDANSETRON HCL 4 MG/2ML IJ SOLN: 4.0000 mg | Freq: Once | INTRAMUSCULAR | Status: DC

## 2019-02-10 MED ORDER — ACETAMINOPHEN 325 MG PO TABS: 650.0000 mg | ORAL_TABLET | Freq: Four times a day (QID) | ORAL | Status: DC | PRN

## 2019-02-10 MED ORDER — GABAPENTIN 600 MG PO TABS: 300.0000 mg | ORAL_TABLET | Freq: Three times a day (TID) | ORAL | 0 refills | Status: DC

## 2019-02-10 MED ORDER — HYDROCODONE-ACETAMINOPHEN 5-325 MG PO TABS: 1.0000 | ORAL_TABLET | Freq: Four times a day (QID) | ORAL | 0 refills | Status: DC | PRN

## 2019-02-10 MED ORDER — VITAMIN B-12 1000 MCG PO TABS
1000.0000 ug | ORAL_TABLET | Freq: Every day | ORAL | Status: DC
  Administered 2019-02-10: 1000 ug via ORAL
  Filled 2019-02-10: qty 1

## 2019-02-10 MED ORDER — GABAPENTIN 600 MG PO TABS
300.0000 mg | ORAL_TABLET | Freq: Three times a day (TID) | ORAL | Status: DC
  Filled 2019-02-10 (×2): qty 0.5

## 2019-02-10 MED ORDER — PREDNISONE 10 MG (21) PO TBPK: ORAL_TABLET | ORAL | 0 refills | Status: DC

## 2019-02-10 MED ORDER — BISACODYL 5 MG PO TBEC: 5.0000 mg | DELAYED_RELEASE_TABLET | Freq: Every day | ORAL | Status: DC | PRN

## 2019-02-10 MED ORDER — METHYLPREDNISOLONE SODIUM SUCC 40 MG IJ SOLR
40.0000 mg | Freq: Four times a day (QID) | INTRAMUSCULAR | Status: DC
  Administered 2019-02-10: 40 mg via INTRAVENOUS
  Filled 2019-02-10: qty 1

## 2019-02-10 MED ORDER — ONDANSETRON HCL 4 MG/2ML IJ SOLN: 4.0000 mg | Freq: Four times a day (QID) | INTRAMUSCULAR | Status: DC | PRN

## 2019-02-10 MED ORDER — HYDROCODONE-ACETAMINOPHEN 5-325 MG PO TABS
1.0000 | ORAL_TABLET | ORAL | Status: DC | PRN
  Administered 2019-02-10 (×2): 2 via ORAL
  Filled 2019-02-10 (×2): qty 2

## 2019-02-10 MED ORDER — LEVOTHYROXINE SODIUM 100 MCG PO TABS: 100.0000 ug | ORAL_TABLET | Freq: Every day | ORAL | Status: DC

## 2019-02-10 MED ORDER — ACETAMINOPHEN 650 MG RE SUPP: 650.0000 mg | Freq: Four times a day (QID) | RECTAL | Status: DC | PRN

## 2019-02-10 MED ORDER — CYCLOBENZAPRINE HCL 10 MG PO TABS
10.0000 mg | ORAL_TABLET | Freq: Three times a day (TID) | ORAL | Status: DC | PRN
  Administered 2019-02-10: 10 mg via ORAL
  Filled 2019-02-10: qty 1

## 2019-02-10 MED ORDER — PREDNISONE 20 MG PO TABS
60.0000 mg | ORAL_TABLET | Freq: Once | ORAL | Status: AC
  Administered 2019-02-10: 60 mg via ORAL
  Filled 2019-02-10: qty 3

## 2019-02-10 MED ORDER — HYDROMORPHONE HCL 1 MG/ML IJ SOLN
0.5000 mg | INTRAMUSCULAR | Status: DC | PRN
  Administered 2019-02-10: 0.5 mg via INTRAVENOUS
  Filled 2019-02-10: qty 1

## 2019-02-10 MED ORDER — ONDANSETRON HCL 4 MG PO TABS: 4.0000 mg | ORAL_TABLET | Freq: Four times a day (QID) | ORAL | Status: DC | PRN

## 2019-02-10 MED ORDER — HYDROMORPHONE HCL 1 MG/ML IJ SOLN
0.5000 mg | Freq: Once | INTRAMUSCULAR | Status: AC
  Administered 2019-02-10: 0.5 mg via INTRAVENOUS
  Filled 2019-02-10: qty 1

## 2019-02-10 MED ORDER — HYDROMORPHONE HCL 1 MG/ML IJ SOLN
1.0000 mg | Freq: Once | INTRAMUSCULAR | Status: AC
  Administered 2019-02-10: 1 mg via INTRAVENOUS
  Filled 2019-02-10: qty 1

## 2019-02-10 NOTE — Discharge Summary (Addendum)
[facility] -  at [facility]   PATIENT NAME: Julie Hale    MR#:  [id number]  DATE OF BIRTH:  1966/06/07  DATE OF ADMISSION:  02/10/2019 ADMITTING PHYSICIAN: Will Bonnet, MD  DATE OF DISCHARGE: 02/10/2019   PRIMARY CARE PHYSICIAN: Doreene Nest, NP    ADMISSION DIAGNOSIS:  Intractable back pain [M54.9] Syncope, unspecified syncope type [R55] Acute midline low back pain with bilateral sciatica [M54.42, M54.41]  DISCHARGE DIAGNOSIS:  Active Problems:   Intractable back pain   SECONDARY DIAGNOSIS:   Past Medical History:  Diagnosis Date  . Anemia   . Anxiety and depression   . Arthritis   . Back pain, chronic   . Hypertension   . Hypothyroidism     HOSPITAL COURSE:   This is 53 year old female with chronic back pain with prior lumbar surgery, hypothyroidism presented with acute on chronic back pain.  1.  Intractable back pain: Likely secondary to herniated disc.    under observation status for pain control and physical therapy.  Will start patient on IV steroid and muscle relaxant. Neuro surgeon saw her- suggest d/c with pain meds and muscle relaxor , tapering steroids and follow in clinic in 3-4 weeks.  2.  Hypothyroidism: Resume current home medications.  3.  Regular diet  Lovenox subcu for DVT prophylaxis.  DISCHARGE CONDITIONS:   Stable.  CONSULTS OBTAINED:  Treatment Team:  Lucy Chris, MD  DRUG ALLERGIES:   Allergies  Allergen Reactions  . Penicillins Hives and Rash  . Erythromycin Hives and Rash    DISCHARGE MEDICATIONS:   Allergies as of 02/10/2019      Reactions   Penicillins Hives, Rash   Erythromycin Hives, Rash      Medication List    STOP taking these medications   ciprofloxacin 500 MG tablet Commonly known as:  Cipro     TAKE these medications   Calcium-Magnesium-Vitamin D 500-250-200 MG-MG-UNIT Tabs Take by mouth.   cyclobenzaprine 10 MG tablet Commonly known as:  FLEXERIL Take  1 tablet (10 mg total) by mouth 3 (three) times daily as needed for muscle spasms.   docusate sodium 100 MG capsule Commonly known as:  COLACE Take 1 capsule (100 mg total) by mouth 2 (two) times daily.   gabapentin 600 MG tablet Commonly known as:  NEURONTIN Take 0.5 tablets (300 mg total) by mouth 3 (three) times daily.   HYDROcodone-acetaminophen 5-325 MG tablet Commonly known as:  NORCO/VICODIN Take 1-2 tablets by mouth every 6 (six) hours as needed for moderate pain or severe pain.   levothyroxine 100 MCG tablet Commonly known as:  SYNTHROID, LEVOTHROID Take 1 tablet by mouth every morning on an empty stomach with a full glass of water.  Do not eat within 30 minutes.   predniSONE 10 MG (21) Tbpk tablet Commonly known as:  STERAPRED UNI-PAK 21 TAB Take 6 tabs first day, 5 tab on day 2, then 4 on day 3rd, 3 tabs on day 4th , 2 tab on day 5th, and 1 tab on 6th day.   Thera Tabs Take by mouth.   vitamin B-12 1000 MCG tablet Commonly known as:  CYANOCOBALAMIN Take 1,000 mcg by mouth daily.        DISCHARGE INSTRUCTIONS:    Follow with PMD in 1-2 weeks.  If you experience worsening of your admission symptoms, develop shortness of breath, life threatening emergency, suicidal or homicidal thoughts you must seek medical attention immediately by calling 911 or calling your MD immediately  if symptoms less severe.  You Must read complete instructions/literature along with all the possible adverse reactions/side effects for all the Medicines you take and that have been prescribed to you. Take any new Medicines after you have completely understood and accept all the possible adverse reactions/side effects.   Please note  You were cared for by a hospitalist during your hospital stay. If you have any questions about your discharge medications or the care you received while you were in the hospital after you are discharged, you can call the unit and asked to speak with the  hospitalist on call if the hospitalist that took care of you is not available. Once you are discharged, your primary care physician will handle any further medical issues. Please note that NO REFILLS for any discharge medications will be authorized once you are discharged, as it is imperative that you return to your primary care physician (or establish a relationship with a primary care physician if you do not have one) for your aftercare needs so that they can reassess your need for medications and monitor your lab values.    Today   CHIEF COMPLAINT:   Chief Complaint  Patient presents with  . Back Pain  . Loss of Consciousness    HISTORY OF PRESENT ILLNESS:  Julie Hale  is a 53 y.o. female with a known history of hypothyroidism, chronic DJD, chronic back pain status post lumbar laminectomy and decompression microdiscectomy of L2-L3 in 2013 presents to the emergency room with worsening of chronic back pain.  Patient reports that 2weeks ago she was doing housework and felt a pop in her back which she attributes to a herniated disc. She was able to treat it with ice and Tylenol. 1 week ago she injured her back again getting into her car. Over the course of the past week the pain has been getting worse to the point where patient had severe pain which caused nausea, lightheadedness and a syncopal episode about 2 hours ago while getting an ice pack. Patient complains of numbness and tingling radiating to both legs, left greater than right.   Patient denies extremity weakness, bowel or bladder incontinence.   Patient denies fever or chills or chest pain or shortness of breath or nausea vomiting.  No recent travel or sick contact.  On arrival to emergency room her vitals are stable.  Lab work-up is unremarkable.  MRI of the lumbar spine was done which showed again herniated disc at L3-4 level.  Patient was treated with multiple IV pain medication in the emergency room however she is continues to  be symptomatic and unable to ambulate.  She was referred to hospital service for admission.   VITAL SIGNS:  Blood pressure 120/78, pulse 78, temperature 99.1 F (37.3 C), temperature source Oral, resp. rate 17, height  (1.702 m), weight 97.5 kg, SpO2 97 %.  I/O:    Intake/Output Summary (Last 24 hours) at 02/10/2019 1714 Last data filed at 02/10/2019 0500 Gross per 24 hour  Intake 1000 ml  Output -  Net 1000 ml    PHYSICAL EXAMINATION:  GENERAL:  53 y.o.-year-old patient lying in the bed with no acute distress.  EYES: Pupils equal, round, reactive to light and accommodation. No scleral icterus. Extraocular muscles intact.  HEENT: Head atraumatic, normocephalic. Oropharynx and nasopharynx clear.  NECK:  Supple, no jugular venous distention. No thyroid enlargement, no tenderness.  LUNGS: Normal breath sounds bilaterally, no wheezing, rales,rhonchi or crepitation. No use of accessory muscles of  respiration.  CARDIOVASCULAR: S1, S2 normal. No murmurs, rubs, or gallops.  ABDOMEN: Soft, non-tender, non-distended. Bowel sounds present. No organomegaly or mass.  EXTREMITIES: No pedal edema, cyanosis, or clubbing.  NEUROLOGIC: Cranial nerves II through XII are intact. Muscle strength 5/5 in all extremities. Sensation intact. Gait not checked.  PSYCHIATRIC: The patient is alert and oriented x 3.  SKIN: No obvious rash, lesion, or ulcer.   DATA REVIEW:   CBC Recent Labs  Lab 02/10/19 0317  WBC 9.3  HGB 13.4  HCT 40.5  PLT 252    Chemistries  Recent Labs  Lab 02/10/19 0317  NA 140  K 3.5  CL 107  CO2 21*  GLUCOSE 108*  BUN 10  CREATININE 0.80  CALCIUM 9.1    Cardiac Enzymes Recent Labs  Lab 02/10/19 0317  TROPONINI <0.03    Microbiology Results  Results for orders placed or performed in visit on 05/06/18  C. trachomatis/N. gonorrhoeae RNA     Status: None   Collection Time: 05/06/18 10:01 AM  Result Value Ref Range Status   C. trachomatis RNA, TMA NOT  DETECTED NOT DETECT Final   N. gonorrhoeae RNA, TMA NOT DETECTED NOT DETECT Final    Comment: This test was performed using the APTIMA COMBO2 Assay ([employer] . The analytical performance characteristics of this  assay, when used to test SurePath specimens have been determined by [employer]. Marland Kitchen     RADIOLOGY:  Mr Lumbar Spine Wo Contrast  Result Date: 02/10/2019 CLINICAL DATA:  Low back pain worsening for 1 week. Pain extends into the left leg EXAM: MRI LUMBAR SPINE WITHOUT CONTRAST TECHNIQUE: Multiplanar, multisequence MR imaging of the lumbar spine was performed. No intravenous contrast was administered. COMPARISON:  None. FINDINGS: Segmentation:  5 lumbar type vertebrae Alignment:  Slight retrolisthesis at L2-3 and L3-4. Vertebrae: No fracture, evidence of discitis, or bone lesion. Laminectomy at L2-3 Conus medullaris and cauda equina: Conus extends to the T12 level. The cauda equina appears normal. Paraspinal and other soft tissues: Negative Disc levels: T12- L1: Unremarkable. L1-L2: Unremarkable. L2-L3: Disc desiccation and narrowing with mild bulge. Laminectomy with patent spinal canal L3-L4: Left paracentral protrusion which could affect the left descending L4 nerve root. Mild degenerative facet spurring. Mild left foraminal narrowing L4-L5: Mild disc bulging with shallow central protrusion. Annular fissure. Negative facets. No impingement L5-S1:Degenerative facet spurring asymmetric to the right. No impingement IMPRESSION: 1. L3-4 left paracentral protrusion which could affect the descending L4 nerve root. 2. L2-3 laminectomy with patent canal. Electronically Signed   By: Marnee Spring M.D.   On: 02/10/2019 05:16    EKG:   Orders placed or performed during the hospital encounter of 02/10/19  . EKG 12-Lead  . EKG 12-Lead      Management plans discussed with the patient, family and they are in agreement.  CODE STATUS:     Code Status Orders  (From admission,  onward)         Start     Ordered   02/10/19 0841  Full code  Continuous     02/10/19 0840        Code Status History    Date Active Date Inactive Code Status Order ID Comments User Context   08/14/2012 1313 08/15/2012 1337 Full Code [id number]  Ginger Organ, RN Inpatient      TOTAL TIME TAKING CARE OF THIS PATIENT: 35 minutes.    Altamese Dilling M.D on 02/10/2019 at 5:14 PM  Between 7am to 6pm -  Pager - [phone]  After 6pm go to [email or web address] - password [email or web address]  [facility]  Office  [phone]  CC: Primary care physician; Doreene Nest, NP   Note: This dictation was prepared with Dragon dictation along with smaller phrase technology. Any transcriptional errors that result from this process are unintentional.

## 2019-02-10 NOTE — Evaluation (Signed)
Physical Therapy Evaluation Patient Details Name: Julie Hale MRN: [id number] DOB: 1966/05/08 Today's Date: 02/10/2019   History of Present Illness  Patient is a pleasant 53 y.o. female with a known history of hypothyroidism, chronic DJD, chronic back pain status post lumbar laminectomy and decompression microdiscectomy of L2-L3 in 2013 presents to the emergency room with worsening of chronic back pain.  Patient has history of feeling a pop and pain in her back with housework which she self treats with ice, Tylenol, and stretches. Last week she was getting into her car when she felt a worsening back pain.  Over the course of the past week the pain has been getting worse to the point where patient had severe pain which caused nausea, lightheadedness and a syncopal episode. Patient complains of numbness and tingling radiating to both legs, left greater than right.    Patient denies extremity weakness, bowel or bladder incontinence.   MRI of the lumbar spine was done in ER with herniated disc at L3-4 level.   Clinical Impression  Patient is a pleasant 53 year old female who presents with diffuse LE weakness and limited stability secondary to low back pain. Imaging shows L3-4 herniated disc potentially explaining the decreased sensation calf level bilaterally with L>R. Patient was unstable initially upon standing and requires occasional wall touches to ambulate due to "feeling whoozy". Patient educated on log roll technique into/onto of bed to decrease pain with transition to/from EOB. Patient performed ankle pumps and nerve glides for decreasing pain with LE movement. Due to patient's pain, limited stability with mobility, and diffuse weakness of LE's skilled physical therapy will be beneficial while in hospital. Upon discharge patient would benefit from [facility] PT and an aide to assist with above deficits and ADLs.     Follow Up Recommendations Home health PT;Supervision - Intermittent    Equipment  Recommendations  Other (comment)(shower chair)    Recommendations for Other Services OT consult     Precautions / Restrictions Precautions Precaution Comments: unsteady due to pain medication/pain in back Restrictions Weight Bearing Restrictions: No      Mobility  Bed Mobility Overal bed mobility: Modified Independent             General bed mobility comments: extra time and use of UE's for transferring into/out of bed  Transfers Overall transfer level: Modified independent   Transfers: Sit to/from Stand;Stand Pivot Transfers Sit to Stand: Supervision;From elevated surface Stand pivot transfers: Min guard;Supervision       General transfer comment: Patient feels "whoozy" upon standing, given CGA due to unsteadiness upon standing position, able to safetly transfer to bedside commode.   Ambulation/Gait Ambulation/Gait assistance: Min guard Gait Distance (Feet): 32 Feet Assistive device: 1 person hand held assist Gait Pattern/deviations: Step-through pattern;Antalgic;Narrow base of support;Decreased stride length Gait velocity: decreased   General Gait Details: Patient intermittently touches walls along with holding PT hand to ambulate, has narrow BOS with limited step length.   Stairs            Wheelchair Mobility    Modified Rankin (Stroke Patients Only)       Balance Overall balance assessment: Needs assistance Sitting-balance support: No upper extremity supported;Feet supported Sitting balance-Leahy Scale: Good Sitting balance - Comments: reaching outside BOS limited by back pain however patient does not lose balance with attempt   Standing balance support: Single extremity supported;During functional activity Standing balance-Leahy Scale: Poor Standing balance comment: Patient can hold static stand without UE support for 1-2 minutes  however for ambulation would benefit from HHA due to pain and "feeling of whooziness".                               Pertinent Vitals/Pain Pain Assessment: 0-10 Pain Score: 4  Pain Location: back (chronic) Pain Descriptors / Indicators: Discomfort Pain Intervention(s): Monitored during session;Repositioned    Home Living Family/patient expects to be discharged to:: Private residence(double wide trailer) Living Arrangements: Children(daughter is moving in the next month or so per patient report) Available Help at Discharge: Family Type of Home: Mobile home Home Access: Stairs to enter Entrance Stairs-Rails: Can reach both(in front) Entrance Stairs-Number of Steps: 4-5 Home Layout: One level Home Equipment: None Additional Comments: Patient reports she has no AD's at home, has accessibility tools at home.     Prior Function Level of Independence: Independent         Comments: Is independent with all ADLs and mobility. Only since Sunday has been having difficulty moving.      Hand Dominance   Dominant Hand: Right    Extremity/Trunk Assessment   Upper Extremity Assessment Upper Extremity Assessment: Generalized weakness(due to pain limiting mobility)    Lower Extremity Assessment Lower Extremity Assessment: RLE deficits/detail;LLE deficits/detail RLE Deficits / Details: grossly 3/5, limited ability to resist pressure due to back pain RLE: Unable to fully assess due to pain RLE Sensation: decreased light touch RLE Coordination: decreased gross motor LLE Deficits / Details: grossly 3/5, limited abilityt o resist pressure due to back pain LLE: Unable to fully assess due to pain LLE Sensation: decreased light touch(decreased L>R) LLE Coordination: decreased gross motor       Communication   Communication: No difficulties  Cognition Arousal/Alertness: Awake/alert Behavior During Therapy: WFL for tasks assessed/performed Overall Cognitive Status: Within Functional Limits for tasks assessed                                 General Comments: Patient AxO x4,  is aware that pain medications are in her system, reports she feels a little more "loopy" than normal.       General Comments General comments (skin integrity, edema, etc.): slight edema of bilateral LE's    Exercises Other Exercises Other Exercises: supine: positioning for pain relief education Other Exercises: transfers: log roll education to decrease pain with transitions, SPT to bed side commode Other Exercises: seated EOB: ankle pumps, nerve glides for pain relief, static sitting balance   Assessment/Plan    PT Assessment Patient needs continued PT services  PT Problem List Decreased strength;Decreased activity tolerance;Decreased coordination;Decreased mobility;Decreased balance;Decreased knowledge of use of DME;Impaired sensation;Pain       PT Treatment Interventions DME instruction;Gait training;Stair training;Therapeutic exercise;Therapeutic activities;Functional mobility training;Balance training;Neuromuscular re-education;Manual techniques;Modalities;Patient/family education    PT Goals (Current goals can be found in the Care Plan section)  Acute Rehab PT Goals Patient Stated Goal: To be able to walk and move with less pain PT Goal Formulation: With patient Time For Goal Achievement: 02/24/19 Potential to Achieve Goals: Fair    Frequency Min 2X/week   Barriers to discharge Decreased caregiver support would benefit from HHPT and aide    Co-evaluation               AM-PAC PT "6 Clicks" Mobility  Outcome Measure Help needed turning from your back to your side while in a flat bed without using  bedrails?: None Help needed moving from lying on your back to sitting on the side of a flat bed without using bedrails?: None Help needed moving to and from a bed to a chair (including a wheelchair)?: A Little Help needed standing up from a chair using your arms (e.g., wheelchair or bedside chair)?: A Little Help needed to walk in hospital room?: A Little Help needed  climbing 3-5 steps with a railing? : A Lot 6 Click Score: 19    End of Session Equipment Utilized During Treatment: Gait belt Activity Tolerance: Patient tolerated treatment well;Patient limited by pain Patient left: in bed;with call bell/phone within reach;with bed alarm set Nurse Communication: Mobility status PT Visit Diagnosis: Unsteadiness on feet (R26.81);Other abnormalities of gait and mobility (R26.89);Muscle weakness (generalized) (M62.81);Pain Pain - part of body: (back)    Time: 1311-1350 PT Time Calculation (min) (ACUTE ONLY): 39 min   Charges:   PT Evaluation $PT Eval Low Complexity: 1 Low PT Treatments $Gait Training: 8-22 mins $Therapeutic Activity: 8-22 mins        Precious Bard, PT, DPT   Precious Bard 02/10/2019, 2:22 PM

## 2019-02-10 NOTE — Progress Notes (Signed)
Patient given discharge orders. Verified information back. RX given. All questions answered. No further needs. Dressed, family aware of where to pick up Family to pick her up at 1830. Sitting in chair eating dinner. Jennifer aware.

## 2019-02-10 NOTE — ED Triage Notes (Signed)
Pt c/o back pain ongoing for 1 week and it got worse the past day. Pt stated that she has problems with her back and she thinks that she hurt it again last week by getting into the car. Pt stated that she also had a syncope episode yesterday while getting a ice pack. EMS gave 50 mcg of fentanyl and 4 mg of Zofran and pt stated that her pain is 10/10. Only thing pt stated she took at home for pain was tylenol and stated that she does not take any pain medication prescribed by her doctor.

## 2019-02-10 NOTE — TOC Initial Note (Signed)
Transition of Care [facility]) - Initial/Assessment Note    Patient Details  Name: Julie Hale MRN: [id number] Date of Birth: 12-Jan-1966  Transition of Care [facility]) CM/SW Contact:    Su Hilt, RN Phone Number: 02/10/2019, 2:36 PM  Clinical Narrative:                 Met with the patient to discuss Home needs and DC plan,  The patient lives in a double wide with several steps to get in, she stated that her adult daughter lives with her and will be helping her at home,  She was provided by the [facility] list per CMS.gov  And chose to use [facility], notified Jason at [facility] of the choice, She wants to get her own DME at [facility] and does not want me to order any for her I encouraged her to let me know if she changes her mind Patient has transportation provided by her daughter   Patient uses [facility] Has Dr. Carlis Abbott as a PCP Patient does not anticipate discharging until she sees Neuro surgery physician  Expected Discharge Plan: [place] Barriers to Discharge: Continued Medical Work up   Patient Goals and CMS Choice Patient states their goals for this hospitalization and ongoing recovery are:: get better and go home CMS Medicare.gov Compare Post Acute Care list provided to:: Patient Choice offered to / list presented to : Patient  Expected Discharge Plan and Services Expected Discharge Plan: [place]   Discharge Planning Services: CM Consult Post Acute Care Choice: [place] arrangements for the past 2 months: Mobile Home                     HH Arranged: PT [facility] Agency: Naomi ([place])  Prior Living Arrangements/Services Living arrangements for the past 2 months: Mobile Home Lives with:: Adult Children Patient language and need for interpreter reviewed:: No Do you feel safe going back to the place where you live?: Yes      Need for Family Participation in Patient Care: No (Comment) Care giver support  system in place?: Yes (comment)   Criminal Activity/Legal Involvement Pertinent to Current Situation/Hospitalization: No - Comment as needed  Activities of Daily Living Home Assistive Devices/Equipment: None ADL Screening (condition at time of admission) Patient's cognitive ability adequate to safely complete daily activities?: Yes Is the patient deaf or have difficulty hearing?: No Does the patient have difficulty seeing, even when wearing glasses/contacts?: No Does the patient have difficulty concentrating, remembering, or making decisions?: No Patient able to express need for assistance with ADLs?: Yes Does the patient have difficulty dressing or bathing?: No Independently performs ADLs?: Yes (appropriate for developmental age) Does the patient have difficulty walking or climbing stairs?: Yes Weakness of Legs: None Weakness of Arms/Hands: None  Permission Sought/Granted Permission sought to share information with : Case Manager Permission granted to share information with : Yes, Verbal Permission Granted     Permission granted to share info w AGENCY: Home health Agency        Emotional Assessment Appearance:: Appears stated age Attitude/Demeanor/Rapport: Engaged Affect (typically observed): Accepting Orientation: : Oriented to Self, Oriented to Place, Oriented to  Time, Oriented to Situation Alcohol / Substance Use: Never Used Psych Involvement: No (comment)  Admission diagnosis:  Intractable back pain [M54.9] Syncope, unspecified syncope type [R55] Acute midline low back pain with bilateral sciatica [M54.42, M54.41] Patient Active Problem List   Diagnosis  Date Noted  . Intractable back pain 02/10/2019  . Preventative health care 03/06/2018  . History of weight loss surgery 01/08/2018  . Hypothyroidism 01/08/2018  . 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

## 2019-02-10 NOTE — Consult Note (Signed)
Neurosurgery-New Consultation Evaluation 02/10/2019 AREN URISTA [id number]  Identifying Statement: Julie Hale is a 53 y.o. female from [place] [address] with acute on chronic back pain  Physician Requesting Consultation: Altamease Oiler.D  History of Present Illness: Julie Hale is hospitalized after presenting with acute on chronic back pain over the past couple weeks.  She has a history of a L2-3 decompression in 2013.  She noted the onset of the current pain approximate 2 weeks ago after doing housework.  She has been treating this at home with over-the-counter medication.  Over the past week she started developing more pain in her back and radiating to the legs, left greater than right. She states that she has had these symptoms like this off and on for years. She is not complaining of any weakness but does feel like there is some tingling in the left leg. She feels the pain goes down the back of the left leg to the calf. Her leg pain is greater than her back pain.   Past Medical History:  Past Medical History:  Diagnosis Date  . Anemia   . Anxiety and depression   . Arthritis   . Back pain, chronic   . Hypertension   . Hypothyroidism     Social History: Social History   Socioeconomic History  . Marital status: Divorced    Spouse name: Not on file  . Number of children: Not on file  . Years of education: Not on file  . Highest education level: Not on file  Occupational History  . Not on file  Social Needs  . Financial resource strain: Not on file  . Food insecurity:    Worry: Not on file    Inability: Not on file  . Transportation needs:    Medical: Not on file    Non-medical: Not on file  Tobacco Use  . Smoking status: Former Smoker    Packs/day: 2.00    Years: 5.00    Pack years: 10.00    Types: Cigarettes    Last attempt to quit: 11/12/2004    Years since quitting: 14.2  . Smokeless tobacco: Never Used  Substance and Sexual Activity  . Alcohol use: Yes     Comment: social only  . Drug use: No  . Sexual activity: Not on file  Lifestyle  . Physical activity:    Days per week: Not on file    Minutes per session: Not on file  . Stress: Not on file  Relationships  . Social connections:    Talks on phone: Not on file    Gets together: Not on file    Attends religious service: Not on file    Active member of club or organization: Not on file    Attends meetings of clubs or organizations: Not on file    Relationship status: Not on file  . Intimate partner violence:    Fear of current or ex partner: Not on file    Emotionally abused: Not on file    Physically abused: Not on file    Forced sexual activity: Not on file  Other Topics Concern  . Not on file  Social History Narrative   Divorced.    2 children, 6 grandchildren.   Works as an Mudlogger.    Enjoys relaxing, spending time with grandchildren.      Family History: Family History  Problem Relation Age of Onset  . Bipolar disorder Mother   . Hypertension Mother   .  Obesity Mother   . Diabetes Mother   . Congestive Heart Failure Mother   . Heart disease Father   . Diabetes Father   . Stroke Father   . Heart attack Brother   . Pulmonary embolism Brother   . Obesity Brother     Review of Systems:  Review of Systems - General ROS: Negative Psychological ROS: Negative Ophthalmic ROS: Negative ENT ROS: Negative Hematological and Lymphatic ROS: Negative  Endocrine ROS: Negative Respiratory ROS: Negative Cardiovascular ROS: Negative Gastrointestinal ROS: Negative Genito-Urinary ROS: Negative Musculoskeletal ROS: Positive for back pain Neurological ROS: Negative for weakness, Positive for left leg pain Dermatological ROS: Negative  Physical Exam: BP 116/71 (BP Location: Right Arm)   Pulse 79   Temp 98.9 F (37.2 C) (Oral)   Resp 18   Ht 5\' 7"  (1.702 m)   Wt 97.5 kg   SpO2 96%   BMI 33.67 kg/m  Body mass index is 33.67 kg/m. Body surface area is  2.15 meters squared. General appearance: Alert, cooperative, in no acute distress Head: Normocephalic, atraumatic Eyes: Normal, EOM intact Oropharynx: Moist without lesions Back: Tender to palpation over midline Ext: No edema in LE bilaterally, warm extremities  Neurologic exam:  Mental status: alertness: alert, affect: normal Speech: fluent and clear Motor:strength symmetric 5/5 in bilateral hip flexion, knee flexion, knee extension, dorsiflexion, plantarflexion Sensory: decreased to light touch in circumferential leg and foot on left Reflexes: 2+ and symmetric bilaterally for patella Gait: Not tested, lying supine in bed   Imaging: MRI lumbar spine: There is a normal lordotic curvature.  There is a mild retrolisthesis of L2 on L3 there is a laminectomy defect at this level posteriorly.  There is some degenerative disc disease noted at L3-4 and L4-5.  Broad-based disc bulge eccentric to the left at L3-4 with resultant lateral recess stenosis.  There is a broad-based disc bulge at L4-5 which results in no significant stenosis.   Impression/Plan:  Julie Hale is here with acute on chronic back pain with a component of left leg radiculopathy, possible L5/S1 dermatome. Her sensation perception is in multiple dermatomes though.  Her MRI does show some stenosis at the L3/4 level and less so at the L4/5 level. I have discussed with her that given the leg symptoms are more acute, I do recommend a conservative approach and I think the steroids and pain medications are appropriate. She does need PT evaluation and can consider a lumbar ESI while here or as outpatient.   I can see her in 3-4 weeks to follow up how she is doing with these treatments in the [facility]  1.  Diagnosis: Low back pain, left lumbar radiculopathy  2.  Plan - Recommend continue conservative treatment with medication, PT, ESI, etc - Can follow up as outpatient

## 2019-02-10 NOTE — ED Notes (Signed)
ED TO INPATIENT HANDOFF REPORT  ED Nurse Name and Phone #: Charyl Dancer [phone]  S Name/Age/Gender Julie Hale 53 y.o. female Room/Bed: ED18A/ED18A  Code Status   Code Status: Prior  Home/SNF/Other Home AAOx4 Is this baseline? Yes   Triage Complete: Triage complete  Chief Complaint back pain  Triage Note Pt c/o back pain ongoing for 1 week and it got worse the past day. Pt stated that she has problems with her back and she thinks that she hurt it again last week by getting into the car. Pt stated that she also had a syncope episode yesterday while getting a ice pack. EMS gave 50 mcg of fentanyl and 4 mg of Zofran and pt stated that her pain is 10/10. Only thing pt stated she took at home for pain was tylenol and stated that she does not take any pain medication prescribed by her doctor.    Allergies Allergies  Allergen Reactions  . Penicillins Hives and Rash  . Erythromycin Hives and Rash    Level of Care/Admitting Diagnosis ED Disposition    ED Disposition Condition Comment   Admit  Hospital Area: [facility] [[id number]]  Level of Care: Med-Surg [16]  Diagnosis: Intractable back pain [720110]  Admitting Physician: Will Bonnet [[id number]]  Attending Physician: Will Bonnet [[id number]]  PT Class (Do Not Modify): Observation [104]  PT Acc Code (Do Not Modify): Observation [10022]       B Medical/Surgery History Past Medical History:  Diagnosis Date  . Anemia   . Anxiety and depression   . Arthritis   . Back pain, chronic   . Hypertension   . Hypothyroidism    Past Surgical History:  Procedure Laterality Date  . ABDOMINAL HYSTERECTOMY  2006  . CHOLECYSTECTOMY  1990  . left hand thumb fusion  2012  . LUMBAR LAMINECTOMY/DECOMPRESSION MICRODISCECTOMY  08/14/2012   Procedure: LUMBAR LAMINECTOMY/DECOMPRESSION MICRODISCECTOMY;  Surgeon: Jacki Cones, MD;  Location: [facility];  Service: Orthopedics;  Laterality: Left;  lumbar 2- 3 left  .  TONSILLECTOMY  1969  . TUBAL LIGATION  1991  . TYMPANOSTOMY TUBE PLACEMENT  1975     A IV Location/Drains/Wounds Patient Lines/Drains/Airways Status   Active Line/Drains/Airways    Name:   Placement date:   Placement time:   Site:   Days:   Peripheral IV 02/10/19 Left Antecubital   02/10/19    0313    Antecubital   less than 1   Incision 08/14/12 Back Other (Comment)   08/14/12    1048     2371          Intake/Output Last 24 hours  Intake/Output Summary (Last 24 hours) at 02/10/2019 0749 Last data filed at 02/10/2019 0500 Gross per 24 hour  Intake 1000 ml  Output -  Net 1000 ml    Labs/Imaging Results for orders placed or performed during the hospital encounter of 02/10/19 (from the past 48 hour(s))  CBC with Differential     Status: None   Collection Time: 02/10/19  3:17 AM  Result Value Ref Range   WBC 9.3 4.0 - 10.5 K/uL   RBC 4.16 3.87 - 5.11 MIL/uL   Hemoglobin 13.4 12.0 - 15.0 g/dL   HCT 29.4 76.5 - 46.5 %   MCV 97.4 80.0 - 100.0 fL   MCH 32.2 26.0 - 34.0 pg   MCHC 33.1 30.0 - 36.0 g/dL   RDW 03.5 46.5 - 68.1 %   Platelets 252 150 - 400 K/uL  nRBC 0.0 0.0 - 0.2 %   Neutrophils Relative % 53 %   Neutro Abs 4.9 1.7 - 7.7 K/uL   Lymphocytes Relative 36 %   Lymphs Abs 3.3 0.7 - 4.0 K/uL   Monocytes Relative 8 %   Monocytes Absolute 0.7 0.1 - 1.0 K/uL   Eosinophils Relative 2 %   Eosinophils Absolute 0.2 0.0 - 0.5 K/uL   Basophils Relative 1 %   Basophils Absolute 0.1 0.0 - 0.1 K/uL   Immature Granulocytes 0 %   Abs Immature Granulocytes 0.02 0.00 - 0.07 K/uL    Comment: Performed at [facility], [address] [place], [place] [address]  Basic metabolic panel     Status: Abnormal   Collection Time: 02/10/19  3:17 AM  Result Value Ref Range   Sodium 140 135 - 145 mmol/L   Potassium 3.5 3.5 - 5.1 mmol/L   Chloride 107 98 - 111 mmol/L   CO2 21 (L) 22 - 32 mmol/L   Glucose, Bld 108 (H) 70 - 99 mg/dL   BUN 10 6 - 20 mg/dL   Creatinine, Ser 6.04  0.44 - 1.00 mg/dL   Calcium 9.1 8.9 - 54.0 mg/dL   GFR calc non Af Amer >60 >60 mL/min   GFR calc Af Amer >60 >60 mL/min   Anion gap 12 5 - 15    Comment: Performed at [facility], [address] [place], [place] [address]  Urinalysis, Complete w Microscopic     Status: Abnormal   Collection Time: 02/10/19  3:17 AM  Result Value Ref Range   Color, Urine YELLOW (A) YELLOW   APPearance HAZY (A) CLEAR   Specific Gravity, Urine 1.013 1.005 - 1.030   pH 7.0 5.0 - 8.0   Glucose, UA NEGATIVE NEGATIVE mg/dL   Hgb urine dipstick NEGATIVE NEGATIVE   Bilirubin Urine NEGATIVE NEGATIVE   Ketones, ur NEGATIVE NEGATIVE mg/dL   Protein, ur NEGATIVE NEGATIVE mg/dL   Nitrite NEGATIVE NEGATIVE   Leukocytes,Ua NEGATIVE NEGATIVE   RBC / HPF 0-5 0 - 5 RBC/hpf   WBC, UA 0-5 0 - 5 WBC/hpf   Bacteria, UA NONE SEEN NONE SEEN   Squamous Epithelial / LPF 0-5 0 - 5   Amorphous Crystal PRESENT     Comment: Performed at [facility], [address] [place], [place] [address]  Troponin I - Once     Status: None   Collection Time: 02/10/19  3:17 AM  Result Value Ref Range   Troponin I <0.03 <0.03 ng/mL    Comment: Performed at [facility], [address] [place], [place] [address]   Mr Lumbar Spine Wo Contrast  Result Date: 02/10/2019 CLINICAL DATA:  Low back pain worsening for 1 week. Pain extends into the left leg EXAM: MRI LUMBAR SPINE WITHOUT CONTRAST TECHNIQUE: Multiplanar, multisequence MR imaging of the lumbar spine was performed. No intravenous contrast was administered. COMPARISON:  None. FINDINGS: Segmentation:  5 lumbar type vertebrae Alignment:  Slight retrolisthesis at L2-3 and L3-4. Vertebrae: No fracture, evidence of discitis, or bone lesion. Laminectomy at L2-3 Conus medullaris and cauda equina: Conus extends to the T12 level. The cauda equina appears normal. Paraspinal and other soft tissues: Negative Disc levels: T12- L1: Unremarkable. L1-L2: Unremarkable. L2-L3:  Disc desiccation and narrowing with mild bulge. Laminectomy with patent spinal canal L3-L4: Left paracentral protrusion which could affect the left descending L4 nerve root. Mild degenerative facet spurring. Mild left foraminal narrowing L4-L5: Mild disc bulging with  shallow central protrusion. Annular fissure. Negative facets. No impingement L5-S1:Degenerative facet spurring asymmetric to the right. No impingement IMPRESSION: 1. L3-4 left paracentral protrusion which could affect the descending L4 nerve root. 2. L2-3 laminectomy with patent canal. Electronically Signed   By: Marnee Spring M.D.   On: 02/10/2019 05:16    Pending Labs [employer] (From admission, onward)    Start     Ordered   Signed and Armed forces training and education officer morning,   R     Signed and Held   Signed and Held  CBC  Tomorrow morning,   R     Signed and Held          Vitals/Pain Today's Vitals   02/10/19 0317 02/10/19 0330 02/10/19 0400 02/10/19 0616  BP: (!) 160/99 137/86 138/86 128/83  Pulse: 80 65 (!) 52 61  Resp: 16 14 13    Temp: 97.8 F (36.6 C)     SpO2: 100% 100% 98% 100%  Weight:      Height:      PainSc:        Isolation Precautions No active isolations  Medications Medications  cyclobenzaprine (FLEXERIL) tablet 10 mg (10 mg Oral Given 02/10/19 0741)  methylPREDNISolone sodium succinate (SOLU-MEDROL) 40 mg/mL injection 40 mg (has no administration in time range)  HYDROmorphone (DILAUDID) injection 0.5 mg (0.5 mg Intravenous Given 02/10/19 0741)  sodium chloride 0.9 % bolus 1,000 mL (0 mLs Intravenous Stopped 02/10/19 0500)  HYDROmorphone (DILAUDID) injection 0.5 mg (0.5 mg Intravenous Given 02/10/19 0350)  diazepam (VALIUM) tablet 2 mg (2 mg Oral Given 02/10/19 0350)  predniSONE (DELTASONE) tablet 60 mg (60 mg Oral Given 02/10/19 0616)  HYDROmorphone (DILAUDID) injection 1 mg (1 mg Intravenous Given 02/10/19 0616)  ketorolac (TORADOL) 30 MG/ML injection 15 mg (15 mg Intravenous Given  02/10/19 0616)    Mobility walks Low fall risk   Focused Assessments Pulmonary Assessment Handoff:  Lung sounds:   O2 Device: Room Air        R Recommendations: See Admitting Provider Note  Report given to:   Additional Notes:

## 2019-02-10 NOTE — Progress Notes (Signed)
OT Cancellation Note  Patient Details Name: Julie Hale MRN: [id number] DOB: 10-22-1966   Cancelled Treatment:    Reason Eval/Treat Not Completed: Patient declined, no reason specified. Consult received, chart reviewed. Upon attempt, pt sleeping, easily wakes to OT's voice. Pt reporting continued back pain but improved from admission. 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 Pt reports having a regular height toilet and agrees with therapist that a toilet riser would be beneficial in addition to a reacher to minimize bending and possible increased strain on her back when performing ADL tasks. Pt politely declines further OT services at this time, including instruction in cognitive behavioral pain coping strategies. Will sign off. Please re-consult if additional needs arise.   Richrd Prime, MPH, MS, OTR/L ascom [phone] 02/10/19, 2:55 PM

## 2019-02-10 NOTE — Telephone Encounter (Addendum)
Yes that would be fine. It looks like she went to the ED yesterday so I'm not sure if she's still interested.

## 2019-02-10 NOTE — Progress Notes (Signed)
   02/10/19 1400  Clinical Encounter Type  Visited With Patient  Visit Type Initial   Patient in with PT at this time, but she indicates that she is feeling better. Will follow up at a later time.

## 2019-02-10 NOTE — Telephone Encounter (Signed)
Pt put in following request via MyChart. Would this be okay as a virtual visit?  Appointment Request From: Julie Hale    With Provider: Doreene Nest, NP [facility]]    Preferred Date Range: 02/10/2019 - 02/10/2019    Preferred Times: Any time    Reason for visit: Request an Appointment    Comments:  I have a ruptured disc in my back. I am in excruciating pain. I had back surgery on L4 and L5 in 2013. This is in the middle of my back and is getting worse by the day in spite of ice packs, Tylenol, and bed rest.

## 2019-02-10 NOTE — ED Provider Notes (Signed)
[facility] Emergency Department Provider Note   ____________________________________________   First MD Initiated Contact with Patient 02/10/19 [phone]     (approximate)  I have reviewed the triage vital signs and the nursing notes.   HISTORY  Chief Complaint Back Pain and Loss of Consciousness    HPI Julie Hale is a 53 y.o. female brought to the ED from home with a chief complaint of acute on chronic back pain.  Patient had lumbar laminectomy/decompression microdiscectomy of L2-3 and 2013.  Has had chronic pain since that she treats with ice and Tylenol.  2 weeks ago she was doing housework and felt a pop in her back which she attributes to a herniated disc.  She was able to treat it with ice and Tylenol.  1 week ago she injured her back again getting into her car.  Over the course of the past week the pain has been getting worse to the point where patient had severe pain which caused nausea, lightheadedness and a syncopal episode about 2 hours ago while getting an ice pack.  Patient complains of numbness and tingling radiating to both legs, left greater than right.  Denies extremity weakness, bowel or bladder incontinence.  Denies recent fever, chills, cough, congestion, chest pain, shortness of breath, abdominal pain, nausea, vomiting, dysuria.  Denies recent travel or trauma.  Denies exposure to persons diagnosed with COVID-19.       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 Authorizing Provider  Calcium-Magnesium-Vitamin D 500-250-200 MG-MG-UNIT TABS Take by mouth.    [provider]  ciprofloxacin (CIPRO) 500 MG tablet Take 1 tablet (500 mg total) by mouth 2 (two) times daily. 04/28/18   Doreene Nest, NP  levothyroxine (SYNTHROID, LEVOTHROID) 100 MCG tablet Take 1 tablet by mouth every morning on an empty stomach with a full glass of water.  Do not eat within 30 minutes. 01/09/18   Doreene Nest, NP  Multiple Vitamin (THERA) TABS Take by mouth.    [provider]  vitamin B-12 (CYANOCOBALAMIN) 1000 MCG tablet Take by mouth.    [provider]    Allergies Penicillins and Erythromycin  Family History  Problem Relation Age of Onset   Bipolar disorder Mother    Hypertension Mother    Obesity Mother    Diabetes Mother    Congestive Heart Failure Mother    Heart disease Father    Diabetes Father    Stroke Father    Heart attack Brother    Pulmonary embolism  Brother    Obesity Brother     Social History Social History   Tobacco Use   Smoking status: Former Smoker    Packs/day: 2.00    Years: 5.00    Pack years: 10.00    Types: Cigarettes    Last attempt to quit: 11/12/2004    Years since quitting: 14.2   Smokeless tobacco: Never Used  Substance Use Topics   Alcohol use: Yes    Comment: social only   Drug use: No    Review of Systems  Constitutional: No fever/chills Eyes: No visual changes. ENT: No sore throat. Cardiovascular: Denies chest pain. Respiratory: Denies shortness  of breath. Gastrointestinal: No abdominal pain.  No nausea, no vomiting.  No diarrhea.  No constipation. Genitourinary: Negative for dysuria. Musculoskeletal: Positive for back pain. Skin: Negative for rash. Neurological: Negative for headaches, focal weakness or numbness.   ____________________________________________   PHYSICAL EXAM:  VITAL SIGNS: ED Triage Vitals  Enc Vitals Group     BP 02/10/19 0317 (!) 160/99     Pulse Rate 02/10/19 0317 80     Resp 02/10/19 0317 16     Temp 02/10/19 0317 97.8 F (36.6 C)     Temp src --      SpO2 02/10/19 0317 100 %     Weight 02/10/19 0315 215 lb (97.5 kg)     Height 02/10/19 0315 5\' 7"  (1.702 m)     Head Circumference --      Peak Flow --      Pain Score 02/10/19 0314 10     Pain Loc --      Pain Edu? --      Excl. in GC? --     Constitutional: Alert and oriented. Well appearing and in mild to moderate acute distress. Eyes: Conjunctivae are normal. PERRL. EOMI. Head: Atraumatic. Nose: No congestion/rhinnorhea. Mouth/Throat: Mucous membranes are moist.  Oropharynx non-erythematous. Neck: No stridor.  No cervical spine tenderness to palpation. Cardiovascular: Normal rate, regular rhythm. Grossly normal heart sounds.  Good peripheral circulation. Respiratory: Normal respiratory effort.  No retractions. Lungs CTAB. Gastrointestinal: Soft and nontender. No distention. No abdominal bruits. No CVA tenderness. Musculoskeletal: Midline lumbar spinal tenderness to palpation.  Paraspinal lumbar muscle spasms.  Negative straight leg raise.  No lower extremity tenderness nor edema.  No joint effusions. Neurologic:  Normal speech and language. No gross focal neurologic deficits are appreciated.  Skin:  Skin is warm, dry and intact. No rash noted. Psychiatric: Mood and affect are normal. Speech and behavior are normal.  ____________________________________________   LABS (all labs ordered are listed, but only abnormal results are  displayed)  Labs Reviewed  BASIC METABOLIC PANEL - Abnormal; Notable for the following components:      Result Value   CO2 21 (*)    Glucose, Bld 108 (*)    All other components within normal limits  URINALYSIS, COMPLETE (UACMP) WITH MICROSCOPIC - Abnormal; Notable for the following components:   Color, Urine YELLOW (*)    APPearance HAZY (*)    All other components within normal limits  CBC WITH DIFFERENTIAL/PLATELET  TROPONIN I   ____________________________________________  EKG  ED ECG REPORT I, Mikaylee Arseneau J, the attending physician, personally viewed and interpreted this ECG.   Date: 02/10/2019  EKG Time: 0318  Rate: 77  Rhythm: normal sinus rhythm  Axis: Normal  Intervals:nonspecific intraventricular conduction delay  ST&T Change: Nonspecific  ____________________________________________  RADIOLOGY  ED MD interpretation: L3-L4 left paracentral protrusion  Official radiology  report(s): Mr Lumbar Spine Wo Contrast  Result Date: 02/10/2019 CLINICAL DATA:  Low back pain worsening for 1 week. Pain extends into the left leg EXAM: MRI LUMBAR SPINE WITHOUT CONTRAST TECHNIQUE: Multiplanar, multisequence MR imaging of the lumbar spine was performed. No intravenous contrast was administered. COMPARISON:  None. FINDINGS: Segmentation:  5 lumbar type vertebrae Alignment:  Slight retrolisthesis at L2-3 and L3-4. Vertebrae: No fracture, evidence of discitis, or bone lesion. Laminectomy at L2-3 Conus medullaris and cauda equina: Conus extends to the T12 level. The cauda equina appears normal. Paraspinal and other soft tissues: Negative Disc levels: T12- L1: Unremarkable. L1-L2: Unremarkable. L2-L3: Disc desiccation and narrowing with mild bulge. Laminectomy with patent spinal canal L3-L4: Left paracentral protrusion which could affect the left descending L4 nerve root. Mild degenerative facet spurring. Mild left foraminal narrowing L4-L5: Mild disc bulging with shallow central protrusion.  Annular fissure. Negative facets. No impingement L5-S1:Degenerative facet spurring asymmetric to the right. No impingement IMPRESSION: 1. L3-4 left paracentral protrusion which could affect the descending L4 nerve root. 2. L2-3 laminectomy with patent canal. Electronically Signed   By: Marnee Spring M.D.   On: 02/10/2019 05:16    ____________________________________________   PROCEDURES  Procedure(s) performed (including Critical Care):  Procedures  CRITICAL CARE Performed by: Irean Hong   Total critical care time: 45 minutes  Critical care time was exclusive of separately billable procedures and treating other patients.  Critical care was necessary to treat or prevent imminent or life-threatening deterioration.  Critical care was time spent personally by me on the following activities: development of treatment plan with patient and/or surrogate as well as nursing, discussions with consultants, evaluation of patient's response to treatment, examination of patient, obtaining history from patient or surrogate, ordering and performing treatments and interventions, ordering and review of laboratory studies, ordering and review of radiographic studies, pulse oximetry and re-evaluation of patient's condition. ____________________________________________   INITIAL IMPRESSION / ASSESSMENT AND PLAN / ED COURSE  As part of my medical decision making, I reviewed the following data within the electronic MEDICAL RECORD NUMBER Nursing notes reviewed and incorporated, Labs reviewed, Old chart reviewed, Radiograph reviewed and Notes from prior ED visits        53 year old female with acute on chronic back pain.  Differential diagnosis includes but is not limited to herniated disc, spinal stenosis, pyelonephritis, cauda equina syndrome, etc.  Julie Hale was evaluated in Emergency Department on 02/10/2019 for the symptoms described in the history of present illness. She was evaluated in the context  of the global COVID-19 pandemic, which necessitated consideration that the patient might be at risk for infection with the SARS-CoV-2 virus that causes COVID-19. Institutional protocols and algorithms that pertain to the evaluation of patients at risk for COVID-19 are in a state of rapid change based on information released by regulatory bodies including the CDC and federal and state organizations. These policies and algorithms were followed during the patient's care in the ED.  Will obtain basic lab work to evaluate syncope otherwise suspect it was secondary to intense pain.  Given patient's history of back issues, will obtain MRI.   Clinical Course as of Feb 10 532  Tue Feb 10, 2019  0531 Updated patient on MRI results.  Patient still complaining of severe pain.  Will re-dose Dilaudid, add steroid and discussed with hospitalist to evaluate patient in the emergency department for intractable back pain.   [JS]    Clinical Course User Index [JS] Irean Hong, MD  ____________________________________________   FINAL CLINICAL IMPRESSION(S) / ED DIAGNOSES  Final diagnoses:  Acute midline low back pain with bilateral sciatica  Syncope, unspecified syncope type  Intractable back pain     ED Discharge Orders    None       Note:  This document was prepared using Dragon voice recognition software and may include unintentional dictation errors.   Irean Hong, MD 02/10/19 [phone]

## 2019-02-10 NOTE — ED Notes (Signed)
Waiting for transport to floor

## 2019-02-10 NOTE — H&P (Signed)
[facility] [place] [facility]   PATIENT NAME: Julie Hale    MR#:  [id number]  DATE OF BIRTH:  Aug 16, 1966  DATE OF ADMISSION:  02/10/2019  PRIMARY CARE PHYSICIAN: Doreene Nest, NP   REQUESTING/REFERRING PHYSICIAN: Emergency room physician  CHIEF COMPLAINT:   Chief Complaint  Patient presents with   Back Pain   Loss of Consciousness    HISTORY OF PRESENT ILLNESS:  Julie Hale  is a 53 y.o. female with a known history of hypothyroidism, chronic DJD, chronic back pain status post lumbar laminectomy and decompression microdiscectomy of L2-L3 in 2013 presents to the emergency room with worsening of chronic back pain.  Patient reports that 2weeks ago she was doing housework and felt a pop in her back which she attributes to a herniated disc.  She was able to treat it with ice and Tylenol.  1 week ago she injured her back again getting into her car.  Over the course of the past week the pain has been getting worse to the point where patient had severe pain which caused nausea, lightheadedness and a syncopal episode about 2 hours ago while getting an ice pack.  Patient complains of numbness and tingling radiating to both legs, left greater than right.    Patient denies extremity weakness, bowel or bladder incontinence.    Patient denies fever or chills or chest pain or shortness of breath or nausea vomiting.  No recent travel or sick contact.  On arrival to emergency room her vitals are stable.  Lab work-up is unremarkable.  MRI of the lumbar spine was done which showed again herniated disc at L3-4 level.  Patient was treated with multiple IV pain medication in the emergency room however she is continues to be symptomatic and unable to ambulate.  She was referred to hospital service for admission.  PAST MEDICAL HISTORY:   Past Medical History:  Diagnosis Date   Anemia    Anxiety and depression    Arthritis    Back pain, chronic    Hypertension     Hypothyroidism     PAST SURGICAL HISTORY:   Past Surgical History:  Procedure Laterality Date   ABDOMINAL HYSTERECTOMY  2006   CHOLECYSTECTOMY  1990   left hand thumb fusion  2012   LUMBAR LAMINECTOMY/DECOMPRESSION MICRODISCECTOMY  08/14/2012   Procedure: LUMBAR LAMINECTOMY/DECOMPRESSION MICRODISCECTOMY;  Surgeon: Jacki Cones, MD;  Location: [facility];  Service: Orthopedics;  Laterality: Left;  lumbar 2- 3 left   TONSILLECTOMY  1969   TUBAL LIGATION  1991   TYMPANOSTOMY TUBE PLACEMENT  1975    SOCIAL HISTORY:   Social History   Tobacco Use   Smoking status: Former Smoker    Packs/day: 2.00    Years: 5.00    Pack years: 10.00    Types: Cigarettes    Last attempt to quit: 11/12/2004    Years since quitting: 14.2   Smokeless tobacco: Never Used  Substance Use Topics   Alcohol use: Yes    Comment: social only    FAMILY HISTORY:   Family History  Problem Relation Age of Onset   Bipolar disorder Mother    Hypertension Mother    Obesity Mother    Diabetes Mother    Congestive Heart Failure Mother    Heart disease Father    Diabetes Father    Stroke Father    Heart attack Brother    Pulmonary embolism Brother    Obesity Brother  DRUG ALLERGIES:   Allergies  Allergen Reactions   Penicillins Hives and Rash   Erythromycin Hives and Rash    REVIEW OF SYSTEMS:   ROS Constitutional: No fever/chills Eyes: No visual changes. ENT: No sore throat. Cardiovascular: Denies chest pain. Respiratory: Denies shortness of breath. Gastrointestinal: No abdominal pain.  No nausea, no vomiting.  No diarrhea.  No constipation. Genitourinary: Negative for dysuria. Musculoskeletal: Positive for back pain. Skin: Negative for rash. Neurological: Negative for headaches, focal weakness or numbness.  MEDICATIONS AT HOME:   Prior to Admission medications   Medication Sig Start Date End Date Taking? Authorizing Provider  levothyroxine (SYNTHROID,  LEVOTHROID) 100 MCG tablet Take 1 tablet by mouth every morning on an empty stomach with a full glass of water.  Do not eat within 30 minutes. 01/09/18  Yes Doreene Nest, NP  Calcium-Magnesium-Vitamin D [phone] MG-MG-UNIT TABS Take by mouth.    [provider]  ciprofloxacin (CIPRO) 500 MG tablet Take 1 tablet (500 mg total) by mouth 2 (two) times daily. Patient not taking: Reported on 02/10/2019 04/28/18   Doreene Nest, NP  Multiple Vitamin (THERA) TABS Take by mouth.    [provider]  vitamin B-12 (CYANOCOBALAMIN) 1000 MCG tablet Take 1,000 mcg by mouth daily.     [provider]      VITAL SIGNS:  Blood pressure 128/83, pulse 61, temperature 97.8 F (36.6 C), resp. rate 13, height 5\' 7"  (1.702 m), weight 97.5 kg, SpO2 100 %.  PHYSICAL EXAMINATION:  Physical Exam  GENERAL:  53 y.o.-year-old patient lying in the bed with no acute distress.  EYES: Pupils equal, round, reactive to light and accommodation. No scleral icterus. Extraocular muscles intact.  HEENT: Head atraumatic, normocephalic. Oropharynx and nasopharynx clear.  NECK:  Supple, no jugular venous distention. No thyroid enlargement, no tenderness.  LUNGS: Normal breath sounds bilaterally, no wheezing, rales,rhonchi or crepitation. No use of accessory muscles of respiration.  CARDIOVASCULAR: S1, S2 normal. No murmurs, rubs, or gallops.  ABDOMEN: Soft, nontender, nondistended. Bowel sounds present. No organomegaly or mass.  EXTREMITIES: No pedal edema, cyanosis, or clubbing.  Musculoskeletal: Midline lumbar spinal tenderness to palpation.  Paraspinal lumbar muscle spasms.  Negative straight leg raise.  No lower extremity tenderness nor edema.  No joint effusions. NEUROLOGIC: Cranial nerves II through XII are intact. Muscle strength 5/5 in all extremities. Sensation intact. Gait not checked.  PSYCHIATRIC: The patient is alert and oriented x 3.  SKIN: No obvious rash, lesion, or ulcer.    LABORATORY PANEL:   CBC Recent Labs  Lab 02/10/19 0317  WBC 9.3  HGB 13.4  HCT 40.5  PLT 252   ------------------------------------------------------------------------------------------------------------------  Chemistries  Recent Labs  Lab 02/10/19 0317  NA 140  K 3.5  CL 107  CO2 21*  GLUCOSE 108*  BUN 10  CREATININE 0.80  CALCIUM 9.1   ------------------------------------------------------------------------------------------------------------------  Cardiac Enzymes Recent Labs  Lab 02/10/19 0317  TROPONINI <0.03   ------------------------------------------------------------------------------------------------------------------  RADIOLOGY:  Mr Lumbar Spine Wo Contrast  Result Date: 02/10/2019 CLINICAL DATA:  Low back pain worsening for 1 week. Pain extends into the left leg EXAM: MRI LUMBAR SPINE WITHOUT CONTRAST TECHNIQUE: Multiplanar, multisequence MR imaging of the lumbar spine was performed. No intravenous contrast was administered. COMPARISON:  None. FINDINGS: Segmentation:  5 lumbar type vertebrae Alignment:  Slight retrolisthesis at L2-3 and L3-4. Vertebrae: No fracture, evidence of discitis, or bone lesion. Laminectomy at L2-3 Conus medullaris and cauda equina: Conus extends to the T12 level.  The cauda equina appears normal. Paraspinal and other soft tissues: Negative Disc levels: T12- L1: Unremarkable. L1-L2: Unremarkable. L2-L3: Disc desiccation and narrowing with mild bulge. Laminectomy with patent spinal canal L3-L4: Left paracentral protrusion which could affect the left descending L4 nerve root. Mild degenerative facet spurring. Mild left foraminal narrowing L4-L5: Mild disc bulging with shallow central protrusion. Annular fissure. Negative facets. No impingement L5-S1:Degenerative facet spurring asymmetric to the right. No impingement IMPRESSION: 1. L3-4 left paracentral protrusion which could affect the descending L4 nerve root. 2. L2-3 laminectomy with  patent canal. Electronically Signed   By: Marnee Spring M.D.   On: 02/10/2019 05:16      IMPRESSION AND PLAN:   This is 53 year old female with chronic back pain with prior lumbar surgery, hypothyroidism presented with acute on chronic back pain.  1.  Intractable back pain: Likely secondary to herniated disc.  Will admit under observation status for pain control and physical therapy.  Will start patient on IV steroid and muscle relaxant.  2.  Hypothyroidism: Resume current home medications.  3.  Regular diet  Lovenox subcu for DVT prophylaxis.    All the records are reviewed and case discussed with ED provider. Management plans discussed with the patient, family and they are in agreement.  CODE STATUS: Full code  TOTAL TIME TAKING CARE OF THIS PATIENT: 35 minutes.    Will Bonnet M.D on 02/10/2019 at 6:58 AM  Between 7am to 6pm - Pager - [phone]  After 6pm go to [email or web address] - Social research officer, government  [facility]  Office  [phone]  CC: Primary care physician; Doreene Nest, NP   Note: This dictation was prepared with Dragon dictation along with smaller phrase technology. Any transcriptional errors that result from this process are unintentional.

## 2019-02-12 ENCOUNTER — Telehealth: Payer: Self-pay

## 2019-02-12 NOTE — Telephone Encounter (Signed)
Yes, Web Ex call. Thanks.

## 2019-02-12 NOTE — Telephone Encounter (Signed)
Patient was at ER on 02/10/2019 and per D/C notes it said to follow up with neurosurgeon-Dr. Adriana Simas on 03/03/2019. Do you want to see patient also? Webex if so? Have not called patient yet to follow up

## 2019-02-13 NOTE — Telephone Encounter (Signed)
Left message to call back. Sending to provider's CMA to continue to follow up

## 2019-02-17 NOTE — Telephone Encounter (Signed)
Noted  

## 2019-02-17 NOTE — Telephone Encounter (Signed)
Spoken to patient and Julie Hale stated that Julie Hale decline making a appointment for the ED follow up. Julie Hale stated that this is an ongoing problem that Julie Hale believe that Dr Adriana Simas will help with the pain. Julie Hale stated that Julie Hale is due for her CPE and will call to schedule this.

## 2019-03-16 ENCOUNTER — Other Ambulatory Visit: Payer: Self-pay | Admitting: Primary Care

## 2019-03-16 DIAGNOSIS — E039 Hypothyroidism, unspecified: Secondary | ICD-10-CM

## 2019-03-16 NOTE — Telephone Encounter (Signed)
Julie Hale, will you please get this patient scheduled for a medication refill virtual visit with me? She will also need a lab only appointment preferably 1 day prior to her appointment with me.  Will you also ask her if she's close to running out of her medication?

## 2019-03-16 NOTE — Telephone Encounter (Signed)
Last prescribed on 01/09/2018 #90 with 3 refills.Last TSH was 01/08/2018. Last office visit on 04/25/2018 for acute. No future appointment

## 2019-03-16 NOTE — Telephone Encounter (Signed)
Noted  

## 2019-03-16 NOTE — Telephone Encounter (Signed)
Called and got patient scheduled. She states she has about 7 days left on her medication.

## 2019-03-18 ENCOUNTER — Other Ambulatory Visit (INDEPENDENT_AMBULATORY_CARE_PROVIDER_SITE_OTHER): Payer: BLUE CROSS/BLUE SHIELD

## 2019-03-18 ENCOUNTER — Other Ambulatory Visit: Payer: BLUE CROSS/BLUE SHIELD

## 2019-03-18 ENCOUNTER — Other Ambulatory Visit: Payer: Self-pay | Admitting: Cardiology

## 2019-03-18 ENCOUNTER — Other Ambulatory Visit: Payer: Self-pay

## 2019-03-18 DIAGNOSIS — Z1239 Encounter for other screening for malignant neoplasm of breast: Secondary | ICD-10-CM

## 2019-03-18 DIAGNOSIS — E039 Hypothyroidism, unspecified: Secondary | ICD-10-CM

## 2019-03-18 LAB — COMPREHENSIVE METABOLIC PANEL
ALT: 14 U/L (ref 0–35)
AST: 18 U/L (ref 0–37)
Albumin: 3.9 g/dL (ref 3.5–5.2)
Alkaline Phosphatase: 57 U/L (ref 39–117)
BUN: 12 mg/dL (ref 6–23)
CO2: 30 mEq/L (ref 19–32)
Calcium: 9.2 mg/dL (ref 8.4–10.5)
Chloride: 104 mEq/L (ref 96–112)
Creatinine, Ser: 0.79 mg/dL (ref 0.40–1.20)
GFR: 75.92 mL/min (ref >60.00)
Glucose, Bld: 82 mg/dL (ref 70–99)
Potassium: 4.8 mEq/L (ref 3.5–5.1)
Sodium: 140 mEq/L (ref 135–145)
Total Bilirubin: 0.5 mg/dL (ref 0.2–1.2)
Total Protein: 6.5 g/dL (ref 6.0–8.3)

## 2019-03-18 LAB — TSH: TSH: 1.55 u[IU]/mL (ref 0.35–4.50)

## 2019-03-18 NOTE — Telephone Encounter (Signed)
Julie Hale accidentally released your MMG order in Epic and re-ordered the Screening MMG order that was in Epic. Patient hasnt had a MMG since 2017 and she was actually due for a Bilateral Diagnostic [id number]. Patient has a virtual visit with you tomorrow at 8:20am. Please find out if she went somewhere else to have a MMG and if not order the Diagnostic MMG and find out where she wants to go for it. Her last one was at [facility] in [place].

## 2019-03-19 ENCOUNTER — Ambulatory Visit (INDEPENDENT_AMBULATORY_CARE_PROVIDER_SITE_OTHER): Payer: BLUE CROSS/BLUE SHIELD | Admitting: Primary Care

## 2019-03-19 ENCOUNTER — Encounter: Payer: Self-pay | Admitting: Primary Care

## 2019-03-19 DIAGNOSIS — E039 Hypothyroidism, unspecified: Secondary | ICD-10-CM | POA: Diagnosis not present

## 2019-03-19 DIAGNOSIS — M5106 Intervertebral disc disorders with myelopathy, lumbar region: Secondary | ICD-10-CM

## 2019-03-19 MED ORDER — LEVOTHYROXINE SODIUM 100 MCG PO TABS: ORAL_TABLET | ORAL | 3 refills | Status: AC

## 2019-03-19 NOTE — Progress Notes (Signed)
Subjective:    Patient ID: Julie Hale, female    DOB: 1966/05/18, 53 y.o.   MRN: [id number]  HPI  Virtual Visit via Video Note  I connected with Julie Hale on 03/19/19 at  8:20 AM EDT by a video enabled telemedicine application and verified that I am speaking with the correct person using two identifiers.  Location: Patient: Home Provider: Office   I discussed the limitations of evaluation and management by telemedicine and the availability of in person appointments. The patient expressed understanding and agreed to proceed.  History of Present Illness:  Julie Hale is a 53 year old female who presents today for medication refill and follow up.  1) Hypothyroidism: Currently managed on levothyroxine 100 mcg. Her last TSH was yesterday with a level of 1.55. She is taking her levothyroxine every morning 30 minutes before breakfast with coffee. She doesn't take with any other medications.   2) Chronic Back Pain: Currently following with neurosurgery. Overall doing well and is not taking Norco, Flexeril, or Gabapentin on a regular basis.    Observations/Objective:  Alert and oriented. Appears well, not sickly. No distress. Speaking in complete sentences.   Assessment and Plan:  See problem based charting.  Follow Up Instructions:  Continue levothyroxine 100 mcg tablets.  Be sure to take your levothyroxine (thyroid medication) every morning on an empty stomach with water only. No food or other medications for 30 minutes. No heartburn medication, iron pills, calcium, vitamin D, or magnesium pills within four hours of taking levothyroxine.   It was a pleasure to see you today! Mayra ReelKate Tanisa Lagace, NP-C    I discussed the assessment and treatment plan with the patient. The patient was provided an opportunity to ask questions and all were answered. The patient agreed with the plan and demonstrated an understanding of the instructions.   The patient was advised to call back or seek  an in-person evaluation if the symptoms worsen or if the condition fails to improve as anticipated.     Doreene NestKatherine K Aycen Porreca, NP    Review of Systems  Eyes: Negative for visual disturbance.  Respiratory: Negative for shortness of breath.   Cardiovascular: Negative for chest pain and palpitations.  Neurological: Negative for dizziness and headaches.       Past Medical History:  Diagnosis Date  . Anemia   . Anxiety and depression   . Arthritis   . Back pain, chronic   . Hypertension   . Hypothyroidism      Social History   Socioeconomic History  . Marital status: Divorced    Spouse name: Not on file  . Number of children: Not on file  . Years of education: Not on file  . Highest education level: Not on file  Occupational History  . Not on file  Social Needs  . Financial resource strain: Not on file  . Food insecurity:    Worry: Not on file    Inability: Not on file  . Transportation needs:    Medical: Not on file    Non-medical: Not on file  Tobacco Use  . Smoking status: Former Smoker    Packs/day: 2.00    Years: 5.00    Pack years: 10.00    Types: Cigarettes    Last attempt to quit: 11/12/2004    Years since quitting: 14.3  . Smokeless tobacco: Never Used  Substance and Sexual Activity  . Alcohol use: Yes    Comment: social only  . Drug use:  No  . Sexual activity: Not on file  Lifestyle  . Physical activity:    Days per week: Not on file    Minutes per session: Not on file  . Stress: Not on file  Relationships  . Social connections:    Talks on phone: Not on file    Gets together: Not on file    Attends religious service: Not on file    Active member of club or organization: Not on file    Attends meetings of clubs or organizations: Not on file    Relationship status: Not on file  . Intimate partner violence:    Fear of current or ex partner: Not on file    Emotionally abused: Not on file    Physically abused: Not on file    Forced sexual  activity: Not on file  Other Topics Concern  . Not on file  Social History Narrative   Divorced.    2 children, 6 grandchildren.   Works as an Mudlogger.    Enjoys relaxing, spending time with grandchildren.     Past Surgical History:  Procedure Laterality Date  . ABDOMINAL HYSTERECTOMY  2006  . CHOLECYSTECTOMY  1990  . left hand thumb fusion  2012  . LUMBAR LAMINECTOMY/DECOMPRESSION MICRODISCECTOMY  08/14/2012   Procedure: LUMBAR LAMINECTOMY/DECOMPRESSION MICRODISCECTOMY;  Surgeon: Jacki Cones, MD;  Location: [facility];  Service: Orthopedics;  Laterality: Left;  lumbar 2- 3 left  . TONSILLECTOMY  1969  . TUBAL LIGATION  1991  . TYMPANOSTOMY TUBE PLACEMENT  1975    Family History  Problem Relation Age of Onset  . Bipolar disorder Mother   . Hypertension Mother   . Obesity Mother   . Diabetes Mother   . Congestive Heart Failure Mother   . Heart disease Father   . Diabetes Father   . Stroke Father   . Heart attack Brother   . Pulmonary embolism Brother   . Obesity Brother     Allergies  Allergen Reactions  . Penicillins Hives and Rash  . Erythromycin Hives and Rash    Current Outpatient Medications on File Prior to Visit  Medication Sig Dispense Refill  . Calcium-Magnesium-Vitamin D 500-250-200 MG-MG-UNIT TABS Take by mouth.    . cyclobenzaprine (FLEXERIL) 10 MG tablet Take 1 tablet (10 mg total) by mouth 3 (three) times daily as needed for muscle spasms. 30 tablet 0  . docusate sodium (COLACE) 100 MG capsule Take 1 capsule (100 mg total) by mouth 2 (two) times daily. 10 capsule 0  . gabapentin (NEURONTIN) 600 MG tablet Take 0.5 tablets (300 mg total) by mouth 3 (three) times daily. 60 tablet 0  . HYDROcodone-acetaminophen (NORCO/VICODIN) 5-325 MG tablet Take 1-2 tablets by mouth every 6 (six) hours as needed for moderate pain or severe pain. 30 tablet 0  . levothyroxine (SYNTHROID, LEVOTHROID) 100 MCG tablet Take 1 tablet by mouth every morning on an empty  stomach with a full glass of water.  Do not eat within 30 minutes. 90 tablet 3  . Multiple Vitamin (THERA) TABS Take by mouth.    . vitamin B-12 (CYANOCOBALAMIN) 1000 MCG tablet Take 1,000 mcg by mouth daily.      No current facility-administered medications on file prior to visit.     There were no vitals taken for this visit.   Objective:   Physical Exam  Constitutional: She is oriented to person, place, and time. She appears well-nourished.  Respiratory: Effort normal. No respiratory distress.  Neurological:  She is alert and oriented to person, place, and time.  Psychiatric: She has a normal mood and affect.           Assessment & Plan:

## 2019-03-19 NOTE — Assessment & Plan Note (Signed)
Recent TSH within goal. Discussed proper instructions for taking levothyroxine. Refills provided.

## 2019-03-19 NOTE — Assessment & Plan Note (Signed)
Following with neurosurgery on an as needed basis. Not requiring medications daily.

## 2019-03-19 NOTE — Patient Instructions (Signed)
Continue levothyroxine 100 mcg tablets.  Be sure to take your levothyroxine (thyroid medication) every morning on an empty stomach with water only. No food or other medications for 30 minutes. No heartburn medication, iron pills, calcium, vitamin D, or magnesium pills within four hours of taking levothyroxine.   It was a pleasure to see you today! Mayra Reel, NP-C

## 2019-04-03 DIAGNOSIS — H43811 Vitreous degeneration, right eye: Secondary | ICD-10-CM | POA: Diagnosis not present

## 2019-05-28 DIAGNOSIS — M5106 Intervertebral disc disorders with myelopathy, lumbar region: Secondary | ICD-10-CM

## 2019-05-29 MED ORDER — CYCLOBENZAPRINE HCL 10 MG PO TABS: 10.0000 mg | ORAL_TABLET | Freq: Three times a day (TID) | ORAL | 0 refills | Status: DC | PRN

## 2019-09-28 ENCOUNTER — Ambulatory Visit: Payer: Self-pay | Admitting: Primary Care

## 2019-09-28 NOTE — Telephone Encounter (Signed)
Pt reports she feels like she has had S/S hypoglycemia for 1-2 years. Pt is not diabetic.  Stated this weekend at brothers, who is diabetic, she had episode where she felt shaky, nauseated, sweating. Checked BS with brothers glucometer; 2 hours after eating, 73.  During weekend ranged from 73 -92. States fasting at 89 and  yesterday evening at 1730  [id number].  Reports this am had similar episode, nausea, shaking, anxiety sweating and "Feeling like blackness over me." States was driving, pulled over and ate, had milkshake a few hours later. Symptoms subsided. No symptoms presently. Also has been checking BP; 114/75 this AM. LAst night 141/100.   Pt scheduled for appt tomorrow prior to triage.  Advised pt if she starts feeling worsening  symptoms or like she may pass out  in meantime to call EMS. Advised to eat every 2 hours, advised on types of food, drink most appropriate. Pt verbalizes understanding. Also advised TN would route to practice for MS. Clarks review and if earlier appt necessary she would hear from practice. Pt verbalizes understanding .  Reason for Disposition . [1] Blood glucose < 70  mg/dL (3.9 mmol/L) or symptomatic, now improved with Care Advice AND [2] cause unknown  Answer Assessment - Initial Assessment Questions 1. SYMPTOMS: "What symptoms are you concerned about?"     States low blood sugar, not diabetic 2. ONSET:  "When did the symptoms start?"    Saturday "But ive been having these spells for 1 to 1 1/2 years 3. BLOOD GLUCOSE: "What is your blood glucose level?"      VAlues from W/E in summary, not sure of todays 4. USUAL RANGE: "What is your blood glucose level usually?" (e.g., usual fasting morning value, usual evening value)     5. TYPE 1 or 2:  "Do you know what type of diabetes you have?"  (e.g., Type 1, Type 2, Gestational; doesn't know)     na 6. INSULIN: "Do you take insulin?" "What type of insulin(s) do you use? What is the mode of delivery? (syringe, pen; injection  or pump) "When did you last give yourself an insulin dose?" (i.e., time or hours/minutes ago) "How much did you give?" (i.e., how many units)     na 7. DETES PILLS: "Do you take any pills for your diabetes?"    na 8. OTHER SYMPTOMS: "Do you have any symptoms?" (e.g., fever, frequent urination, difficulty breathing, vomiting)    When occurs; nausea, shaking, sweating, anxiety, "Blackness coming over." 9. LOW BLOOD GLUCOSE TREATMENT: "What have you done so far to treat the low blood glucose level?"     *No Answer* 10. FOOD: "When did you last eat or drink?"       *No Answer* 11. ALONE: "Are you alone right now or is someone with you?"        *No Answer* 12. PREGNANCY: "Is there any chance you are pregnant?" "When was your last menstrual period?"       *No Answer*  Protocols used: DIABETES - LOW BLOOD SUGAR-A-AH

## 2019-09-28 NOTE — Telephone Encounter (Addendum)
I skyped Julie Hale and she said TN was triage nurse and MS was Ms Gentry Fitz NP. Velta Addison said she had advised pt to call EMS if she felt worse and pt was OK during the call. Pt already has appt 09/29/19 at 11:40 with Gentry Fitz NP.

## 2019-09-29 ENCOUNTER — Other Ambulatory Visit: Payer: Self-pay

## 2019-09-29 ENCOUNTER — Ambulatory Visit (INDEPENDENT_AMBULATORY_CARE_PROVIDER_SITE_OTHER): Payer: BC Managed Care – PPO | Admitting: Primary Care

## 2019-09-29 ENCOUNTER — Encounter: Payer: Self-pay | Admitting: Primary Care

## 2019-09-29 VITALS — BP 130/82 | HR 82 | Temp 96.3°F | Ht 67.0 in | Wt 209.5 lb

## 2019-09-29 DIAGNOSIS — E039 Hypothyroidism, unspecified: Secondary | ICD-10-CM | POA: Diagnosis not present

## 2019-09-29 DIAGNOSIS — M5106 Intervertebral disc disorders with myelopathy, lumbar region: Secondary | ICD-10-CM

## 2019-09-29 DIAGNOSIS — Z9884 Bariatric surgery status: Secondary | ICD-10-CM | POA: Diagnosis not present

## 2019-09-29 DIAGNOSIS — R55 Syncope and collapse: Secondary | ICD-10-CM | POA: Diagnosis not present

## 2019-09-29 DIAGNOSIS — R200 Anesthesia of skin: Secondary | ICD-10-CM | POA: Diagnosis not present

## 2019-09-29 DIAGNOSIS — R202 Paresthesia of skin: Secondary | ICD-10-CM

## 2019-09-29 LAB — CBC
HCT: 39.2 % (ref 36.0–46.0)
Hemoglobin: 13.1 g/dL (ref 12.0–15.0)
MCHC: 33.5 g/dL (ref 30.0–36.0)
MCV: 97 fl (ref 78.0–100.0)
Platelets: 258 10*3/uL (ref 150.0–400.0)
RBC: 4.04 Mil/uL (ref 3.87–5.11)
RDW: 13.6 % (ref 11.5–15.5)
WBC: 7 10*3/uL (ref 4.0–10.5)

## 2019-09-29 LAB — COMPREHENSIVE METABOLIC PANEL
ALT: 18 U/L (ref 0–35)
AST: 16 U/L (ref 0–37)
Albumin: 4.2 g/dL (ref 3.5–5.2)
Alkaline Phosphatase: 62 U/L (ref 39–117)
BUN: 16 mg/dL (ref 6–23)
CO2: 30 mEq/L (ref 19–32)
Calcium: 9.4 mg/dL (ref 8.4–10.5)
Chloride: 105 mEq/L (ref 96–112)
Creatinine, Ser: 0.72 mg/dL (ref 0.40–1.20)
GFR: 84.33 mL/min (ref >60.00)
Glucose, Bld: 91 mg/dL (ref 70–99)
Potassium: 4.5 mEq/L (ref 3.5–5.1)
Sodium: 140 mEq/L (ref 135–145)
Total Bilirubin: 0.3 mg/dL (ref 0.2–1.2)
Total Protein: 6.7 g/dL (ref 6.0–8.3)

## 2019-09-29 LAB — HEMOGLOBIN A1C: Hgb A1c MFr Bld: 5.1 % (ref 4.6–6.5)

## 2019-09-29 LAB — VITAMIN D 25 HYDROXY (VIT D DEFICIENCY, FRACTURES): VITD: 35.19 ng/mL (ref 30.00–100.00)

## 2019-09-29 LAB — VITAMIN B12: Vitamin B-12: 310 pg/mL (ref 211–911)

## 2019-09-29 LAB — TSH: TSH: 1.32 u[IU]/mL (ref 0.35–4.50)

## 2019-09-29 MED ORDER — GABAPENTIN 300 MG PO CAPS: ORAL_CAPSULE | ORAL | 0 refills | Status: DC

## 2019-09-29 NOTE — Assessment & Plan Note (Signed)
Using gabapentin 300 mg sparingly, refill provided.

## 2019-09-29 NOTE — Progress Notes (Signed)
Subjective:    Patient ID: Julie Hale, female    DOB: [birth year], 53 y.o.   MRN: [id number]  HPI  Ms. Julie Hale is a 53 year old female with a history of bariatric surgery, hypothyroidism, chronic back pain who presents today with a chief complaint syncope. She would also like a refill of her gabapentin.  She believes she may be having low blood sugar due to episodes that have been occurring.   She endorses "blackouts" and has had near syncopal and syncopal episodes. This most recently occurred in March 2020 with a syncopal episode. Near syncopal episodes have occurred intermittently since March 2020, about six total.   She will sometimes be in the bathtub or walking and will start to see "blackness" in her visual fields with a feeling like she will pass out. Yesterday she was driving [place] [id number] and felt numbness to her left finger tips with the "blacking out" and a "fuzzy" feeling. She also had chest heaviness. She pulled over and started taking with her family member who recommended she stop for candy and soda. She went to a drug store, purchased a soda and candy, felt better only a little better after eating and drinking. She then went to [facility] and got herself a cheeseburger and fries, started feeling a little better after eating about half of the meal but continued to feel "bad overall". She then stopped [facility] and got herself a chocolate milkshake, still didn't feel well. She later then ate an egg sandwich and drank milk, took a nap and felt better.   Other symptoms include nausea, hot sweats, increased anxiety, increased hunger, fatigue, blurry vision. Episodes began about 1-2 years ago with more frequent episodes gradually since March 2020. Her last syncopal episode was years ago prior to March 2020.  She checked her blood sugar at her brother's house three evenings ago before eating dinner which was 73. Fasting glucose readings have been 80's-90's. Hysterectomy in her 2940's,  she does have her ovaries. Her last eye exam was in 2020 and was stable. She rarely takes her cyclobenzaprine and hydrocodone. She is taking 300 mg of gabapentin infrequently for acute back pain, still has the same prescription that was provided in March 2020. She is drinking 2-5 cups of coffee and 1-2 bottles of water daily.   BP Readings from Last 3 Encounters:  09/29/19 130/82  02/10/19 120/78  04/25/18 116/80   Wt Readings from Last 3 Encounters:  09/29/19 209 lb 8 oz (95 kg)  02/10/19 215 lb (97.5 kg)  04/25/18 210 lb 12 oz (95.6 kg)     Review of Systems  Constitutional: Negative for fatigue and unexpected weight change.  Eyes: Positive for visual disturbance.  Respiratory: Negative for shortness of breath.   Cardiovascular:       Chest heaviness   Gastrointestinal: Positive for nausea.  Neurological: Positive for light-headedness.  Psychiatric/Behavioral: The patient is not nervous/anxious.        Past Medical History:  Diagnosis Date  . Anemia   . Anxiety and depression   . Arthritis   . Back pain, chronic   . Hypertension   . Hypothyroidism      Social History   Socioeconomic History  . Marital status: Divorced    Spouse name: Not on file  . Number of children: Not on file  . Years of education: Not on file  . Highest education level: Not on file  Occupational History  . Not on file  Social Needs  . Financial resource strain: Not on file  . Food insecurity    Worry: Not on file    Inability: Not on file  . Transportation needs    Medical: Not on file    Non-medical: Not on file  Tobacco Use  . Smoking status: Former Smoker    Packs/day: 2.00    Years: 5.00    Pack years: 10.00    Types: Cigarettes    Quit date: 11/12/2004    Years since quitting: 14.8  . Smokeless tobacco: Never Used  Substance and Sexual Activity  . Alcohol use: Yes    Comment: social only  . Drug use: No  . Sexual activity: Not on file  Lifestyle  . Physical activity     Days per week: Not on file    Minutes per session: Not on file  . Stress: Not on file  Relationships  . Social Herbalist on phone: Not on file    Gets together: Not on file    Attends religious service: Not on file    Active member of club or organization: Not on file    Attends meetings of clubs or organizations: Not on file    Relationship status: Not on file  . Intimate partner violence    Fear of current or ex partner: Not on file    Emotionally abused: Not on file    Physically abused: Not on file    Forced sexual activity: Not on file  Other Topics Concern  . Not on file  Social History Narrative   Divorced.    2 children, 6 grandchildren.   Works as an Educational psychologist.    Enjoys relaxing, spending time with grandchildren.     Past Surgical History:  Procedure Laterality Date  . ABDOMINAL HYSTERECTOMY  2006  . CHOLECYSTECTOMY  1990  . left hand thumb fusion  2012  . LUMBAR LAMINECTOMY/DECOMPRESSION MICRODISCECTOMY  08/14/2012   Procedure: LUMBAR LAMINECTOMY/DECOMPRESSION MICRODISCECTOMY;  Surgeon: Tobi Bastos, MD;  Location: [facility];  Service: Orthopedics;  Laterality: Left;  lumbar 2- 3 left  . TONSILLECTOMY  1969  . TUBAL LIGATION  1991  . TYMPANOSTOMY TUBE PLACEMENT  1975    Family History  Problem Relation Age of Onset  . Bipolar disorder Mother   . Hypertension Mother   . Obesity Mother   . Diabetes Mother   . Congestive Heart Failure Mother   . Heart disease Father   . Diabetes Father   . Stroke Father   . Heart attack Brother   . Pulmonary embolism Brother   . Obesity Brother     Allergies  Allergen Reactions  . Penicillins Hives and Rash  . Erythromycin Hives and Rash    Current Outpatient Medications on File Prior to Visit  Medication Sig Dispense Refill  . Calcium-Magnesium-Vitamin D [phone] MG-MG-UNIT TABS Take by mouth.    . docusate sodium (COLACE) 100 MG capsule Take 1 capsule (100 mg total) by mouth 2 (two) times  daily. 10 capsule 0  . gabapentin (NEURONTIN) 600 MG tablet Take 0.5 tablets (300 mg total) by mouth 3 (three) times daily. 60 tablet 0  . levothyroxine (SYNTHROID) 100 MCG tablet Take 1 tablet by mouth every morning on an empty stomach with a full glass of water.  Do not eat within 30 minutes. 90 tablet 3  . Multiple Vitamin (THERA) TABS Take by mouth.    . vitamin B-12 (CYANOCOBALAMIN) 1000 MCG tablet Take  1,000 mcg by mouth daily.     . cyclobenzaprine (FLEXERIL) 10 MG tablet Take 1 tablet (10 mg total) by mouth 3 (three) times daily as needed for muscle spasms. (Patient not taking: Reported on 09/29/2019) 30 tablet 0  . HYDROcodone-acetaminophen (NORCO/VICODIN) 5-325 MG tablet Take 1-2 tablets by mouth every 6 (six) hours as needed for moderate pain or severe pain. (Patient not taking: Reported on 09/29/2019) 30 tablet 0   No current facility-administered medications on file prior to visit.     BP 130/82   Pulse 82   Temp (!) 96.3 F (35.7 C) (Temporal)   Ht 5\' 7"  (1.702 m)   Wt 209 lb 8 oz (95 kg)   SpO2 98%   BMI 32.81 kg/m    Objective:   Physical Exam  Constitutional: She is oriented to person, place, and time. She appears well-nourished.  Eyes: EOM are normal.  Neck: Neck supple.  Cardiovascular: Normal rate and regular rhythm.  Respiratory: Effort normal and breath sounds normal.  Neurological: She is alert and oriented to person, place, and time. No cranial nerve deficit. She displays a negative Romberg sign. Coordination normal.  Skin: Skin is warm and dry.  Psychiatric: She has a normal mood and affect.           Assessment & Plan:

## 2019-09-29 NOTE — Assessment & Plan Note (Signed)
Chronic since March 2020 after hospital admission for acute intractable back pain.  Orthostatic vitals negative. ECG with NSR, rate of 75, no ST changes or T-wave inversion. Appears similar to prior ECG in early 2020.  Check labs today including CBC, CMP, TSH, B12, Vitamin D.   Differentials include cardiac cause, dehydration due to little water intake, anemia, thyroid disorder, vitamin deficiency given bariatric surgery.   Strongly encouraged she increase water intake to 64 ounces daily. Consider cardiology vs neuro evaluation. Await results. She is stable for outpatient treatment.

## 2019-09-29 NOTE — Patient Instructions (Signed)
Stop by the lab prior to leaving today. I will notify you of your results once received.   Ensure you are consuming 64 ounces of water daily.  It was a pleasure to see you today!   

## 2019-09-30 ENCOUNTER — Ambulatory Visit: Payer: BLUE CROSS/BLUE SHIELD | Admitting: Primary Care

## 2019-09-30 DIAGNOSIS — R55 Syncope and collapse: Secondary | ICD-10-CM

## 2019-10-01 NOTE — Telephone Encounter (Signed)
Patient prefers the [employer] She stated that she absolutely can not do 12/2 or 12/3 Any other day she can make work as long as they are early a/m or late afternoon

## 2019-10-16 ENCOUNTER — Encounter: Payer: Self-pay | Admitting: Cardiology

## 2019-10-16 ENCOUNTER — Other Ambulatory Visit: Payer: Self-pay

## 2019-10-16 ENCOUNTER — Ambulatory Visit (INDEPENDENT_AMBULATORY_CARE_PROVIDER_SITE_OTHER): Payer: BC Managed Care – PPO | Admitting: Cardiology

## 2019-10-16 VITALS — BP 118/84 | HR 72 | Ht 67.0 in | Wt 210.5 lb

## 2019-10-16 DIAGNOSIS — R55 Syncope and collapse: Secondary | ICD-10-CM

## 2019-10-16 NOTE — Patient Instructions (Addendum)
Medication Instructions:  - Your physician recommends that you continue on your current medications as directed. Please refer to the Current Medication list given to you today.  *If you need a refill on your cardiac medications before your next appointment, please call your pharmacy*  Lab Work: - none ordered  If you have labs (blood work) drawn today and your tests are completely normal, you will receive your results only by: Julie Hale MyChart Message (if you have MyChart) OR . A paper copy in the mail If you have any lab test that is abnormal or we need to change your treatment, we will call you to review the results.  Testing/Procedures: - Your physician has requested that you have an echocardiogram. Echocardiography is a painless test that uses sound waves to create images of your heart. It provides your doctor with information about the size and shape of your heart and how well your heart's chambers and valves are working. This procedure takes approximately one hour. There are no restrictions for this procedure.  Follow-Up: At [facility], you and your health needs are our priority.  As part of our continuing mission to provide you with exceptional heart care, we have created designated Provider Care Teams.  These Care Teams include your primary Cardiologist (physician) and Advanced Practice Providers (APPs -  Physician Assistants and Nurse Practitioners) who all work together to provide you with the care you need, when you need it.  Your next appointment:    as needed- pending the results of your echo  The format for your next appointment:   n/a  Provider:   Debbe Odea, MD  Other Instructions N/a   Echocardiogram An echocardiogram is a procedure that uses painless sound waves (ultrasound) to produce an image of the heart. Images from an echocardiogram can provide important information about:  Signs of coronary artery disease (CAD).  Aneurysm detection. An aneurysm is a  weak or damaged part of an artery wall that bulges out from the normal force of blood pumping through the body.  Heart size and shape. Changes in the size or shape of the heart can be associated with certain conditions, including heart failure, aneurysm, and CAD.  Heart muscle function.  Heart valve function.  Signs of a past heart attack.  Fluid buildup around the heart.  Thickening of the heart muscle.  A tumor or infectious growth around the heart valves. Tell a health care provider about:  Any allergies you have.  All medicines you are taking, including vitamins, herbs, eye drops, creams, and over-the-counter medicines.  Any blood disorders you have.  Any surgeries you have had.  Any medical conditions you have.  Whether you are pregnant or may be pregnant. What are the risks? Generally, this is a safe procedure. However, problems may occur, including:  Allergic reaction to dye (contrast) that may be used during the procedure. What happens before the procedure? No specific preparation is needed. You may eat and drink normally. What happens during the procedure?   An IV tube may be inserted into one of your veins.  You may receive contrast through this tube. A contrast is an injection that improves the quality of the pictures from your heart.  A gel will be applied to your chest.  A wand-like tool (transducer) will be moved over your chest. The gel will help to transmit the sound waves from the transducer.  The sound waves will harmlessly bounce off of your heart to allow the heart images to be  captured in real-time motion. The images will be recorded on a computer. The procedure may vary among health care providers and hospitals. What happens after the procedure?  You may return to your normal, everyday life, including diet, activities, and medicines, unless your health care provider tells you not to do that. Summary  An echocardiogram is a procedure that uses  painless sound waves (ultrasound) to produce an image of the heart.  Images from an echocardiogram can provide important information about the size and shape of your heart, heart muscle function, heart valve function, and fluid buildup around your heart.  You do not need to do anything to prepare before this procedure. You may eat and drink normally.  After the echocardiogram is completed, you may return to your normal, everyday life, unless your health care provider tells you not to do that. This information is not intended to replace advice given to you by your health care provider. Make sure you discuss any questions you have with your health care provider. Document Released: 10/26/2000 Document Revised: 02/19/2019 Document Reviewed: 12/01/2016 Elsevier Patient Education  2020 [employer].

## 2019-10-16 NOTE — Progress Notes (Signed)
Cardiology Office Note:    Date:  10/16/2019   ID:  Julie RaseStena M Criscuolo, DOB 12/10/1965, MRN [id number]  PCP:  Doreene Nestlark, Katherine K, NP  Cardiologist:  Debbe OdeaBrian Agbor-Etang, MD  Electrophysiologist:  None   Referring MD: Doreene Nestlark, Katherine K, NP   Chief Complaint  Patient presents with  . other    Syncope, flunctuating BP. Meds reviewed verbally with pt.   Julie Hale is a 53 y.o. female who is being seen today for the evaluation of syncope at the request of Doreene Nestlark, Katherine K, NP.  History of Present Illness:    Julie Hale is a 53 y.o. female with a hx of anxiety, former smoker x10 years, hypothyroidism presents due to syncope. Patient states first having an episode of syncope in 2013 after she had back surgery.  Prior to having back surgery, she was diagnosed with hypertension.  After the surgery, her blood pressure normalized but she was still taking blood pressure meds.  She had a feeling of dizziness, dark vision changes, flushing as and feeling of her heart rate racing prior to passing out while walking.  Blood pressure medication was stopped and she felt okay on 2 4 years ago, while having a mammogram, she passed out after taking multiple inspirations and expirations.  Again prior to passing out, she felt dizzy, darkness covering her side, fluffiness.  Then about 9 months ago, patient was on her bed while she suddenly felt hot flashes.  She stood up to open the window and get some air, when she felt dizzy, feeling of dark vision, her heart racing.  He did not pass out this time but laid on her bed.  About 2 or 3 more episodes have occurred since then with the same prodromal symptoms.  She tries to brace for something to prevent her from falling.  She denies chest pain or shortness of breath with the symptoms.  She denies any personal history of cardiac disease.  Past Medical History:  Diagnosis Date  . Anemia   . Anxiety and depression   . Arthritis   . Back pain, chronic   . Hypertension    . Hypothyroidism     Past Surgical History:  Procedure Laterality Date  . ABDOMINAL HYSTERECTOMY  2006  . CHOLECYSTECTOMY  1990  . left hand thumb fusion  2012  . LUMBAR LAMINECTOMY/DECOMPRESSION MICRODISCECTOMY  08/14/2012   Procedure: LUMBAR LAMINECTOMY/DECOMPRESSION MICRODISCECTOMY;  Surgeon: Jacki Conesonald A Gioffre, MD;  Location: [facility];  Service: Orthopedics;  Laterality: Left;  lumbar 2- 3 left  . TONSILLECTOMY  1969  . TUBAL LIGATION  1991  . TYMPANOSTOMY TUBE PLACEMENT  1975    Current Medications: Current Meds  Medication Sig  . Calcium-Magnesium-Vitamin D [phone] MG-MG-UNIT TABS Take by mouth.  . docusate sodium (COLACE) 100 MG capsule Take 1 capsule (100 mg total) by mouth 2 (two) times daily.  Marland Kitchen. gabapentin (NEURONTIN) 300 MG capsule Take 1 tablet by mouth 1-2 times daily as needed for back pain.  Marland Kitchen. levothyroxine (SYNTHROID) 100 MCG tablet Take 1 tablet by mouth every morning on an empty stomach with a full glass of water.  Do not eat within 30 minutes.  Marland Kitchen. MELATONIN PO Take by mouth as needed.  . Multiple Vitamin (THERA) TABS Take by mouth.  . vitamin B-12 (CYANOCOBALAMIN) 1000 MCG tablet Take 1,000 mcg by mouth daily.      Allergies:   Penicillins and Erythromycin   Social History   Socioeconomic History  . Marital status: Divorced  Spouse name: Not on file  . Number of children: Not on file  . Years of education: Not on file  . Highest education level: Not on file  Occupational History  . Not on file  Social Needs  . Financial resource strain: Not on file  . Food insecurity    Worry: Not on file    Inability: Not on file  . Transportation needs    Medical: Not on file    Non-medical: Not on file  Tobacco Use  . Smoking status: Former Smoker    Packs/day: 2.00    Years: 5.00    Pack years: 10.00    Types: Cigarettes    Quit date: 11/12/2004    Years since quitting: 14.9  . Smokeless tobacco: Never Used  Substance and Sexual Activity  . Alcohol use:  Yes    Comment: social only  . Drug use: No  . Sexual activity: Not on file  Lifestyle  . Physical activity    Days per week: Not on file    Minutes per session: Not on file  . Stress: Not on file  Relationships  . Social Herbalist on phone: Not on file    Gets together: Not on file    Attends religious service: Not on file    Active member of club or organization: Not on file    Attends meetings of clubs or organizations: Not on file    Relationship status: Not on file  Other Topics Concern  . Not on file  Social History Narrative   Divorced.    2 children, 6 grandchildren.   Works as an Educational psychologist.    Enjoys relaxing, spending time with grandchildren.      Family History: The patient's family history includes Bipolar disorder in her mother; Congestive Heart Failure in her mother; Diabetes in her father and mother; Heart attack in her brother; Heart disease in her father; Hypertension in her mother; Obesity in her brother and mother; Pulmonary embolism in her brother; Stroke in her father.  ROS:   Please see the history of present illness.     All other systems reviewed and are negative.  EKGs/Labs/Other Studies Reviewed:    The following studies were reviewed today:   EKG:  EKG is  ordered today.  The ekg ordered today demonstrates normal sinus rhythm, normal EKG.  Recent Labs: 09/29/2019: ALT 18; BUN 16; Creatinine, Ser 0.72; Hemoglobin 13.1; Platelets 258.0; Potassium 4.5; Sodium 140; TSH 1.32  Recent Lipid Panel    Component Value Date/Time   CHOL 162 03/03/2018 0822   TRIG 68.0 03/03/2018 0822   HDL 66.50 03/03/2018 0822   CHOLHDL 2 03/03/2018 0822   VLDL 13.6 03/03/2018 0822   LDLCALC 82 03/03/2018 0822    Physical Exam:    VS:  BP 118/84 (BP Location: Right Arm, Patient Position: Sitting, Cuff Size: Large)   Pulse 72   Ht 5\' 7"  (1.702 m)   Wt 210 lb 8 oz (95.5 kg)   SpO2 98%   BMI 32.97 kg/m     Wt Readings from Last 3  Encounters:  10/16/19 210 lb 8 oz (95.5 kg)  09/29/19 209 lb 8 oz (95 kg)  02/10/19 215 lb (97.5 kg)     GEN:  Well nourished, well developed in no acute distress HEENT: Normal NECK: No JVD; No carotid bruits LYMPHATICS: No lymphadenopathy CARDIAC: RRR, no murmurs, rubs, gallops RESPIRATORY:  Clear to auscultation without rales, wheezing or rhonchi  ABDOMEN: Soft, non-tender, non-distended MUSCULOSKELETAL:  No edema; No deformity  SKIN: Warm and dry NEUROLOGIC:  Alert and oriented x 3 PSYCHIATRIC:  Normal affect   ASSESSMENT:   Patient with history of syncope.  Orthostatic vitals in the office did not reveal any evidence for orthostasis although she felt dizzy upon standing up from a seated position.   Patient's describes symptoms are not consistent with a cardiac cause of syncope.  She has some classic prodromal symptoms associated with reflex syncope such as lightheadedness, sweating, palpitations, visual darkening.  She has no cardiac risk factors.  Her EKG shows normal sinus rhythm.  1. Syncope and collapse    PLAN:    In order of problems listed above:  1. will get echo to evaluate for any structural abnormalities for syncopal work-up.  This note was generated in part or whole with voice recognition software. Voice recognition is usually quite accurate but there are transcription errors that can and very often do occur. I apologize for any typographical errors that were not detected and corrected.  Medication Adjustments/Labs and Tests Ordered: Current medicines are reviewed at length with the patient today.  Concerns regarding medicines are outlined above.  Orders Placed This Encounter  Procedures  . EKG 12-Lead  . ECHOCARDIOGRAM COMPLETE   No orders of the defined types were placed in this encounter.   Patient Instructions  Medication Instructions:  - Your physician recommends that you continue on your current medications as directed. Please refer to the Current  Medication list given to you today.  *If you need a refill on your cardiac medications before your next appointment, please call your pharmacy*  Lab Work: - none ordered  If you have labs (blood work) drawn today and your tests are completely normal, you will receive your results only by: Marland Kitchen MyChart Message (if you have MyChart) OR . A paper copy in the mail If you have any lab test that is abnormal or we need to change your treatment, we will call you to review the results.  Testing/Procedures: - Your physician has requested that you have an echocardiogram. Echocardiography is a painless test that uses sound waves to create images of your heart. It provides your doctor with information about the size and shape of your heart and how well your heart's chambers and valves are working. This procedure takes approximately one hour. There are no restrictions for this procedure.  Follow-Up: At [facility], you and your health needs are our priority.  As part of our continuing mission to provide you with exceptional heart care, we have created designated Provider Care Teams.  These Care Teams include your primary Cardiologist (physician) and Advanced Practice Providers (APPs -  Physician Assistants and Nurse Practitioners) who all work together to provide you with the care you need, when you need it.  Your next appointment:    as needed  The format for your next appointment:   n/a  Provider:   Debbe Odea, MD  Other Instructions n/a     Signed, Debbe Odea, MD  10/16/2019 5:06 PM    [facility]

## 2019-10-19 ENCOUNTER — Telehealth: Payer: Self-pay | Admitting: Cardiology

## 2019-10-19 NOTE — Telephone Encounter (Signed)
lmov to schedule echo  °

## 2019-11-24 ENCOUNTER — Other Ambulatory Visit: Payer: Self-pay

## 2019-12-18 ENCOUNTER — Other Ambulatory Visit: Payer: Self-pay

## 2019-12-18 DIAGNOSIS — M5106 Intervertebral disc disorders with myelopathy, lumbar region: Secondary | ICD-10-CM

## 2019-12-21 MED ORDER — GABAPENTIN 300 MG PO CAPS: ORAL_CAPSULE | ORAL | 0 refills | Status: DC

## 2019-12-21 NOTE — Telephone Encounter (Signed)
Last prescribed on 09/29/2019 . Last appointment on 09/29/2019. No future appointment

## 2019-12-21 NOTE — Telephone Encounter (Signed)
Refill sent to pharmacy.   

## 2020-01-07 ENCOUNTER — Telehealth: Payer: Self-pay | Admitting: Primary Care

## 2020-01-07 NOTE — Telephone Encounter (Signed)
FYI.Marland KitchenMarland KitchenMarland KitchenSpoken and notified patient of Graylon Gunning comments. Patient stated that the eye doctor recommended some scanning to be done since this has been going for 4 months now. Patient denied any symptoms of a stroke.  I have schedule patient on Friday 01/08/2020 at 11:45 am with Deboraha Sprang since patient would like to get this does as soon as possible and not wait for Kate's return.

## 2020-01-07 NOTE — Telephone Encounter (Signed)
Pt called and states that she saw her Eye Doctor today and was advised to contact her PCP to discuss pregression of her blurred vision/worsening eye sight and headaches over the last 4 months. Pt was seen in our office in November 2020 following a syncopal episode. Pt had a heart work up and was referred to Cardiology. Pt states that her heart and labs checked out fine. She was seen by her Eye Dr today d/t worsening symptoms over the last 4-6 weeks; new double vision in her right eye and new symptoms that have been brought to her attention by her daughters - stumbling over her words when she speaks, repeating things and struggling to find her words at times. Pt states that she does have scar tissue noted by her eye doctor (from previous cataract sx) and droopy eye lids. With the patients Family History of Massive Stroke (post surgery) of her Father, her Eye Doctor wants PCP to rule out possible stroke/TIAs.   Pt advised that I will send to [facility] to advise on further testing.

## 2020-01-07 NOTE — Telephone Encounter (Signed)
Noted  

## 2020-01-07 NOTE — Telephone Encounter (Signed)
I agree that she needs further work up. Please have patient schedule with me or another provider at her earliest convenience. If she is having acute symptoms of one-sided weakness, facial drooping, double vision, slurred speech then she needs to go to the hospital immediately for stroke evaluation.

## 2020-01-08 ENCOUNTER — Ambulatory Visit (INDEPENDENT_AMBULATORY_CARE_PROVIDER_SITE_OTHER): Payer: 59 | Admitting: Family Medicine

## 2020-01-08 ENCOUNTER — Telehealth: Payer: Self-pay

## 2020-01-08 ENCOUNTER — Ambulatory Visit
Admission: RE | Admit: 2020-01-08 | Discharge: 2020-01-08 | Disposition: A | Discharge status: Home / Self Care | Payer: 59 | Source: Ambulatory Visit | Attending: Family Medicine | Admitting: Family Medicine

## 2020-01-08 ENCOUNTER — Encounter: Payer: Self-pay | Admitting: Family Medicine

## 2020-01-08 ENCOUNTER — Other Ambulatory Visit: Payer: Self-pay

## 2020-01-08 VITALS — BP 118/92 | HR 81 | Temp 97.7°F | Resp 12 | Wt 215.5 lb

## 2020-01-08 DIAGNOSIS — H02401 Unspecified ptosis of right eyelid: Secondary | ICD-10-CM

## 2020-01-08 DIAGNOSIS — H532 Diplopia: Secondary | ICD-10-CM

## 2020-01-08 DIAGNOSIS — R29818 Other symptoms and signs involving the nervous system: Secondary | ICD-10-CM

## 2020-01-08 DIAGNOSIS — H5461 Unqualified visual loss, right eye, normal vision left eye: Secondary | ICD-10-CM | POA: Insufficient documentation

## 2020-01-08 DIAGNOSIS — H5500 Unspecified nystagmus: Secondary | ICD-10-CM

## 2020-01-08 DIAGNOSIS — R9389 Abnormal findings on diagnostic imaging of other specified body structures: Secondary | ICD-10-CM

## 2020-01-08 NOTE — Progress Notes (Signed)
Subjective:    Patient ID: Julie Hale, female    DOB: 1966/01/03, 54 y.o.   MRN: [id number]  HPI Chief Complaint  Patient presents with  . Discuss further testing    for eye/visual problems and h/a x 4 months-r/o possible stroke?   This is a 54 yo female who presents today for above cc.   Last may had a floater in eye, saw opthalmology. Saw again yesterday, worsening right eye vision. Some double vision. Had extra imaging. More scarring. Has appointment with eye surgeon in a couple of weeks. She said that she is concerned about change in vision, double vision. Has been told that she struggles to find her words, noticed by her family.   Father with carotid disease, CAD.   Near syncope- had work up, saw cardiology. Last episode was 10 or 11/21.   Review of Systems Per HPI    Objective:   Physical Exam Vitals reviewed.  Constitutional:      Appearance: Normal appearance. She is obese.  HENT:     Head: Normocephalic and atraumatic.  Eyes:     Extraocular Movements: Extraocular movements intact.     Right eye: Nystagmus present. Normal extraocular motion.     Left eye: Nystagmus present.     Conjunctiva/sclera: Conjunctivae normal.     Pupils: Pupils are equal, round, and reactive to light.     Comments: Ptosis R>L  Neck:     Vascular: No carotid bruit.  Cardiovascular:     Rate and Rhythm: Normal rate and regular rhythm.     Heart sounds: Normal heart sounds.  Musculoskeletal:     Cervical back: Normal range of motion and neck supple.     Right lower leg: No edema.     Left lower leg: No edema.  Skin:    General: Skin is warm and dry.  Neurological:     General: No focal deficit present.     Mental Status: She is alert and oriented to person, place, and time.     Cranial Nerves: No cranial nerve deficit.     Sensory: No sensory deficit.     Motor: No weakness.     Coordination: Coordination normal.     Gait: Gait normal.     Deep Tendon Reflexes: Reflexes  normal.  Psychiatric:        Mood and Affect: Mood normal.        Behavior: Behavior normal.        Thought Content: Thought content normal.        Judgment: Judgment normal.       BP (!) 118/92   Pulse 81   Temp 97.7 F (36.5 C)   Resp 12   Wt 215 lb 8 oz (97.8 kg)   SpO2 97%   BMI 33.75 kg/m  Wt Readings from Last 3 Encounters:  01/08/20 215 lb 8 oz (97.8 kg)  10/16/19 210 lb 8 oz (95.5 kg)  09/29/19 209 lb 8 oz (95 kg)       Assessment & Plan:  Spoke with radiology, Dr. Carlis Abbott, regarding appropriate imaging. Spoke with [facility] who reviewed note from yesterday and did not see any documented retinal abnormality.   1. Decreased vision of right eye - MR Brain Wo Contrast; Future  2. Double vision - MR Brain Wo Contrast; Future  3. Nystagmus - MR Brain Wo Contrast; Future  4. Other symptoms and signs involving the nervous system - MR Brain Wo Contrast; Future  5. Ptosis of eyelid, right - MR Brain Wo Contrast; Future  This visit occurred during the SARS-CoV-2 public health emergency.  Safety protocols were in place, including screening questions prior to the visit, additional usage of staff PPE, and extensive cleaning of exam room while observing appropriate contact time as indicated for disinfecting solutions.    Olean Ree, FNP-BC  [facility], [facility]  01/08/2020 2:02 PM

## 2020-01-08 NOTE — Patient Instructions (Signed)
Good to see you today  You will get a call later this afternoon about scheduling MRI

## 2020-01-08 NOTE — Telephone Encounter (Signed)
IMPRESSION: No evidence of recent infarction.  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 No associated edema to suggest recent hemorrhage.

## 2020-01-08 NOTE — Telephone Encounter (Signed)
Noted  

## 2020-01-14 ENCOUNTER — Encounter: Payer: Self-pay | Admitting: Primary Care

## 2020-02-03 ENCOUNTER — Other Ambulatory Visit: Payer: Self-pay

## 2020-02-03 DIAGNOSIS — M5106 Intervertebral disc disorders with myelopathy, lumbar region: Secondary | ICD-10-CM

## 2020-02-05 MED ORDER — GABAPENTIN 300 MG PO CAPS: ORAL_CAPSULE | ORAL | 0 refills | Status: DC

## 2020-03-27 DIAGNOSIS — M5106 Intervertebral disc disorders with myelopathy, lumbar region: Secondary | ICD-10-CM

## 2020-03-28 MED ORDER — GABAPENTIN 300 MG PO CAPS: ORAL_CAPSULE | ORAL | 0 refills | Status: DC

## 2020-03-28 NOTE — Telephone Encounter (Signed)
Last written 02-05-20 #30 Last OV 09/2019 No Future OV [address]

## 2020-05-13 ENCOUNTER — Telehealth (INDEPENDENT_AMBULATORY_CARE_PROVIDER_SITE_OTHER): Payer: 59 | Admitting: Primary Care

## 2020-05-13 DIAGNOSIS — M5106 Intervertebral disc disorders with myelopathy, lumbar region: Secondary | ICD-10-CM

## 2020-05-13 MED ORDER — PREDNISONE 20 MG PO TABS: ORAL_TABLET | ORAL | 0 refills | Status: AC

## 2020-05-13 MED ORDER — GABAPENTIN 300 MG PO CAPS: ORAL_CAPSULE | ORAL | 0 refills | Status: AC

## 2020-05-13 NOTE — Patient Instructions (Signed)
Start prednisone 20 mg. Take 2 tablets by mouth for four days, then 1 tablet for four days.  Remember to stretch, use heat/ice, remain active.  Please update me if no improvement.  It was a pleasure to see you today! Mayra Reel, NP-C

## 2020-05-13 NOTE — Progress Notes (Signed)
Subjective:    Patient ID: Julie Hale, female    DOB: 1966-08-26, 54 y.o.   MRN: [id number]  HPI  Virtual Visit via Video Note  I connected with Julie Hale on 05/13/20 at  3:20 PM EDT by a video enabled telemedicine application and verified that I am speaking with the correct person using two identifiers.  Location: Patient: Home Provider: Office   I discussed the limitations of evaluation and management by telemedicine and the availability of in person appointments. The patient expressed understanding and agreed to proceed.  Patient and I are both participating on visit.  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   Her pain became sudden this morning. She was leaning over her desk to move an object and felt a sudden onset of left lower back, hip, and left lower extremity pain. She's had this occur on numerous occasions in the past, was prescribed steroids with improvement. She was following with a chiropractor but can no longer afford. Her last flare of back pain was about one year ago.   She's been doing well with PRN gabapentin at bedtime for which she takes sparingly. Today she's not taken anything today for her pain. She is requesting a refill of her gabapentin.  She denies numbness/tingling, loss of bowel/bladder control.   Observations/Objective:  Alert and oriented. Appears well, not sickly. No distress. Speaking in complete sentences. Decrease in ROM in lumbar spine with moderate pain.  Assessment and Plan:  Acute on chronic lower back pain, aggravated by positional change. No alarm signs. Agree to steroid course which was sent to pharmacy. Refill provided for gabapentin.  Stretch, heat/ice, topical agents in the mean time. She will update.   Follow Up Instructions:  Start  prednisone 20 mg. Take 2 tablets by mouth for four days, then 1 tablet for four days.  Remember to stretch, use heat/ice, remain active.  Please update me if no improvement.  It was a pleasure to see you today! Julie Reel, NP-C    I discussed the assessment and treatment plan with the patient. The patient was provided an opportunity to ask questions and all were answered. The patient agreed with the plan and demonstrated an understanding of the instructions.   The patient was advised to call back or seek an in-person evaluation if the symptoms worsen or if the condition fails to improve as anticipated.    Doreene Nest, NP    Review of Systems  Musculoskeletal: Positive for arthralgias and back pain.  Skin: Negative for color change.  Neurological: Negative for weakness and numbness.       Past Medical History:  Diagnosis Date  . Anemia   . Anxiety and depression   . Arthritis   . Back pain, chronic   . Hypertension   . Hypothyroidism      Social History   Socioeconomic History  . Marital status: Divorced    Spouse name: Not on file  . Number of children: Not on file  . Years of education: Not on file  . Highest education level: Not on file  Occupational History  . Not on file  Tobacco Use  . Smoking status: Former Smoker    Packs/day: 2.00    Years: 5.00    Pack years: 10.00    Types:  Cigarettes    Quit date: 11/12/2004    Years since quitting: 15.5  . Smokeless tobacco: Never Used  Substance and Sexual Activity  . Alcohol use: Yes    Comment: social only  . Drug use: No  . Sexual activity: Not on file  Other Topics Concern  . Not on file  Social History Narrative   Divorced.    2 children, 6 grandchildren.   Works as an Mudlogger.    Enjoys relaxing, spending time with grandchildren.    Social Determinants of Health   Financial Resource Strain:   . Difficulty of Paying Living Expenses:   Food Insecurity:   . Worried About Community education officer in the Last Year:   . Barista in the Last Year:   Transportation Needs:   . Freight forwarder (Medical):   Marland Kitchen Lack of Transportation (Non-Medical):   Physical Activity:   . Days of Exercise per Week:   . Minutes of Exercise per Session:   Stress:   . Feeling of Stress :   Social Connections:   . Frequency of Communication with Friends and Family:   . Frequency of Social Gatherings with Friends and Family:   . Attends Religious Services:   . Active Member of Clubs or Organizations:   . Attends Banker Meetings:   Marland Kitchen Marital Status:   Intimate Partner Violence:   . Fear of Current or Ex-Partner:   . Emotionally Abused:   Marland Kitchen Physically Abused:   . Sexually Abused:     Past Surgical History:  Procedure Laterality Date  . ABDOMINAL HYSTERECTOMY  2006  . CHOLECYSTECTOMY  1990  . left hand thumb fusion  2012  . LUMBAR LAMINECTOMY/DECOMPRESSION MICRODISCECTOMY  08/14/2012   Procedure: LUMBAR LAMINECTOMY/DECOMPRESSION MICRODISCECTOMY;  Surgeon: Jacki Cones, MD;  Location: [facility];  Service: Orthopedics;  Laterality: Left;  lumbar 2- 3 left  . TONSILLECTOMY  1969  . TUBAL LIGATION  1991  . TYMPANOSTOMY TUBE PLACEMENT  1975    Family History  Problem Relation Age of Onset  . Bipolar disorder Mother   . Hypertension Mother   . Obesity Mother   . Diabetes Mother   . Congestive Heart Failure Mother   . Heart disease Father   . Diabetes Father   . Stroke Father   . Heart attack Brother   . Pulmonary embolism Brother   . Obesity Brother     Allergies  Allergen Reactions  . Penicillins Hives and Rash  . Erythromycin Hives and Rash    Current Outpatient Medications on File Prior to Visit  Medication Sig Dispense Refill  . Calcium-Magnesium-Vitamin D 500-250-200 MG-MG-UNIT TABS Take by mouth.    . docusate sodium (COLACE) 100 MG capsule Take 1 capsule (100 mg total) by mouth 2 (two) times daily. 10 capsule 0  . levothyroxine (SYNTHROID)  100 MCG tablet Take 1 tablet by mouth every morning on an empty stomach with a full glass of water.  Do not eat within 30 minutes. 90 tablet 3  . MELATONIN PO Take by mouth as needed.    . Multiple Vitamin (THERA) TABS Take by mouth.    . vitamin B-12 (CYANOCOBALAMIN) 1000 MCG tablet Take 1,000 mcg by mouth daily.      No current facility-administered medications on file prior to visit.    There were no vitals taken for this visit.   Objective:   Physical Exam Musculoskeletal:     Comments: Decrease in  ROM to lumbar spine due to pain.  Neurological:     Mental Status: She is alert.  Psychiatric:        Mood and Affect: Mood normal.            Assessment & Plan:

## 2020-05-13 NOTE — Assessment & Plan Note (Signed)
Acute on chronic lower back pain, aggravated by positional change. No alarm signs. Agree to steroid course which was sent to pharmacy. Refill provided for gabapentin.  Stretch, heat/ice, topical agents in the mean time. She will update.

## 2020-05-17 ENCOUNTER — Telehealth: Payer: 59 | Admitting: Primary Care

## 2020-07-04 ENCOUNTER — Telehealth: Payer: Self-pay | Admitting: Cardiology

## 2020-07-04 NOTE — Telephone Encounter (Signed)
Patient declined echo

## 2020-12-22 IMAGING — MR MRI LUMBAR SPINE WITHOUT CONTRAST
5 series · 34 of 48 positions shown · non-contrast
Comparison: None.

CLINICAL DATA: Low back pain worsening for 1 week. Pain extends
into the left leg

EXAM:
MRI LUMBAR SPINE WITHOUT CONTRAST
TECHNIQUE: Multiplanar, multisequence MR imaging of the lumbar spine was
performed. No intravenous contrast was administered.

[Series 5: T2 · sagittal · 4.0mm · 1.02mm/px · 6 of 16 slices shown (1 of 2)]
[im 1/16]
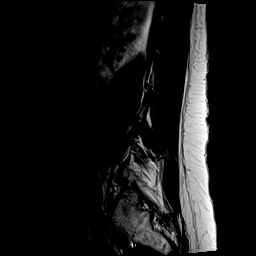
[im 4/16]
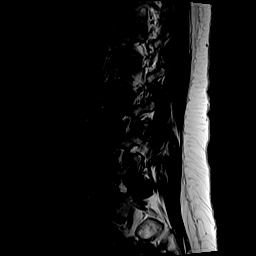
[im 7/16]
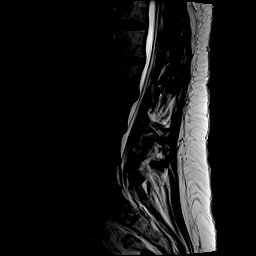
[im 10/16]
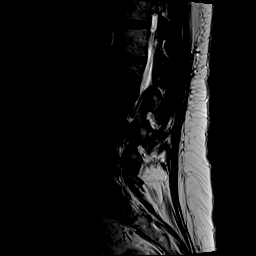
[im 13/16]
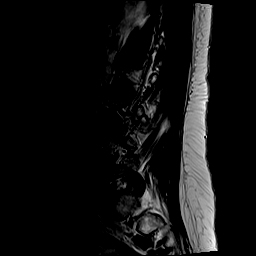
[im 16/16]
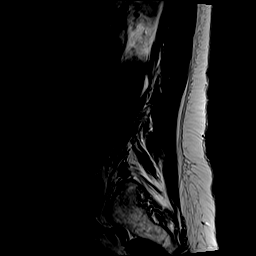

[Series 6: T1 · sagittal · 4.0mm · 1.02mm/px · 6 of 15 slices shown (1 of 2)]
[im 1/15]
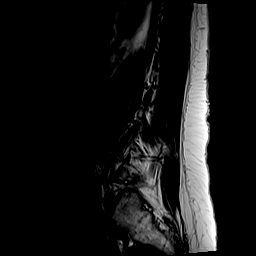
[im 3/15]
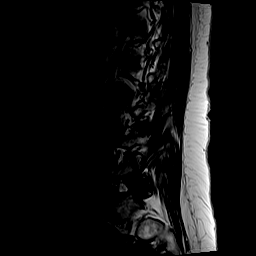
[im 6/15]
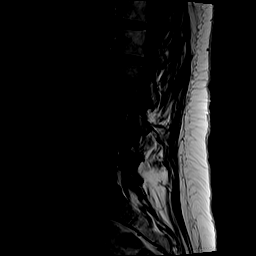
[im 9/15]
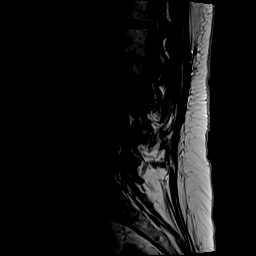
[im 12/15]
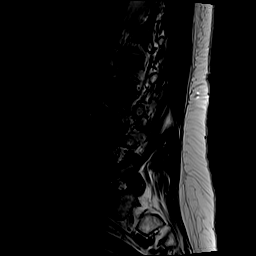
[im 15/15]
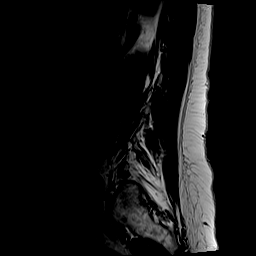

[Series 7: STIR · sagittal · 4.0mm · 0.51mm/px · 4 of 15 slices shown]
[im 1/15]
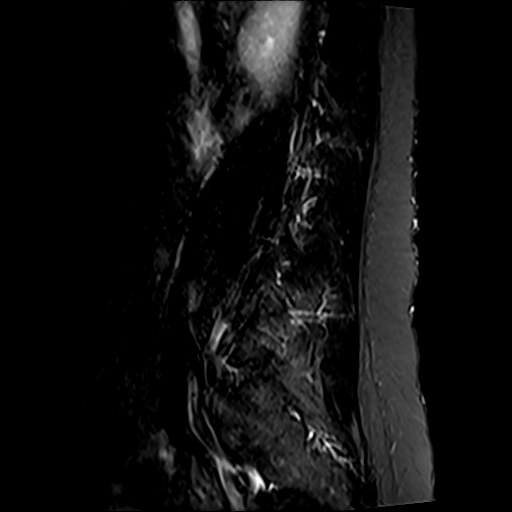
[im 3/15]
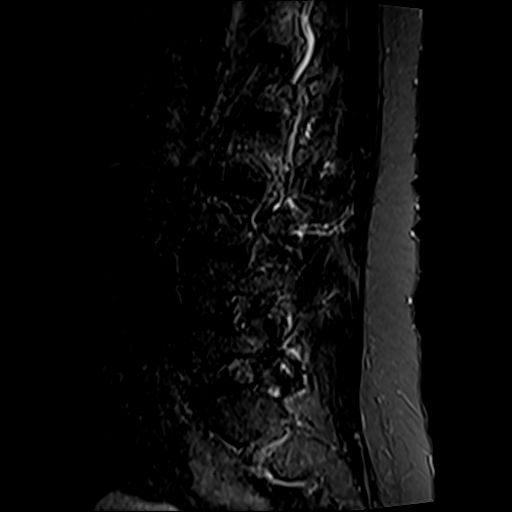
[im 6/15]
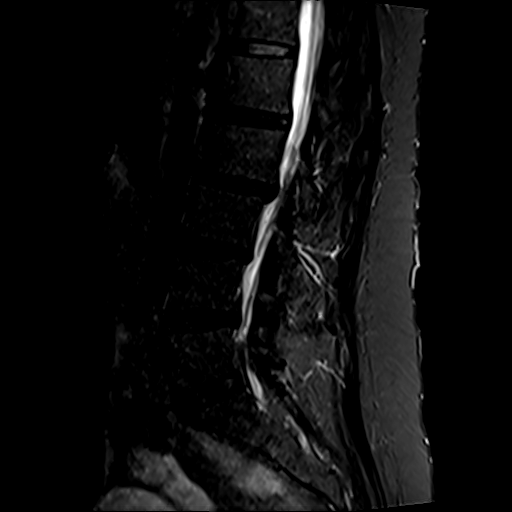
[im 9/15]
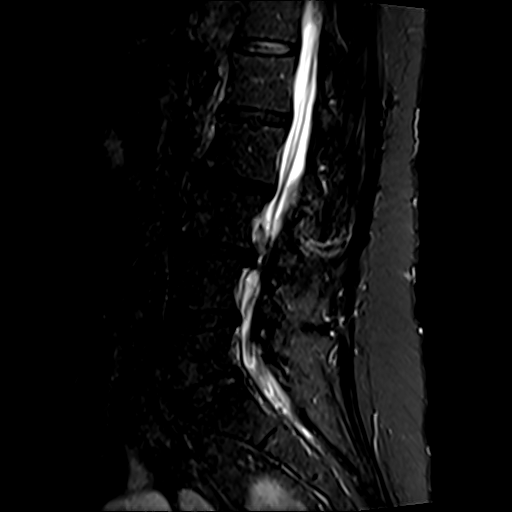

[Series 8: T2 · axial · 4.0mm · 0.78mm/px · z∈[-114,+102]mm · 9 of 36 slices shown (2 of 2)]
[im 1/36]
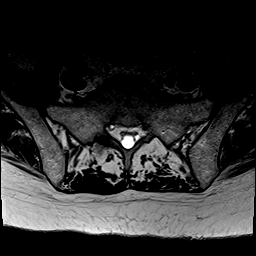
[im 6/36]
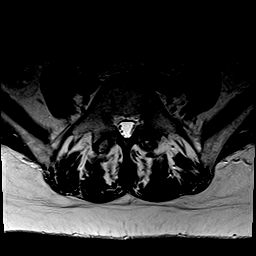
[im 11/36]
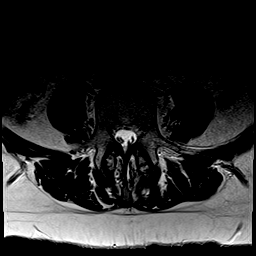
[im 16/36]
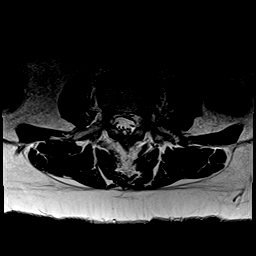
[im 18/36]
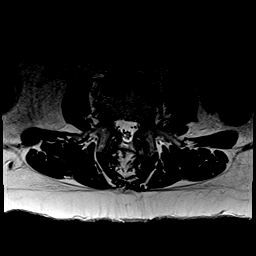
[im 21/36]
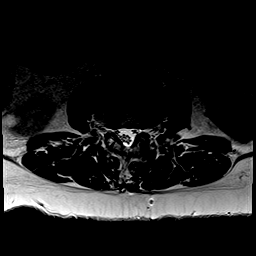
[im 26/36]
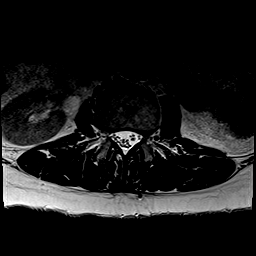
[im 31/36]
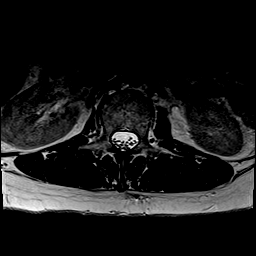
[im 36/36]
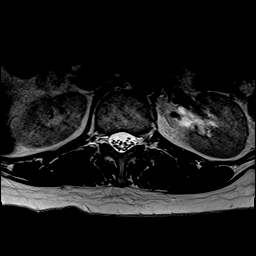

[Series 9: T1 · axial · 4.0mm · 0.39mm/px · z∈[-114,+102]mm · 9 of 36 slices shown (2 of 2)]
[im 1/36]
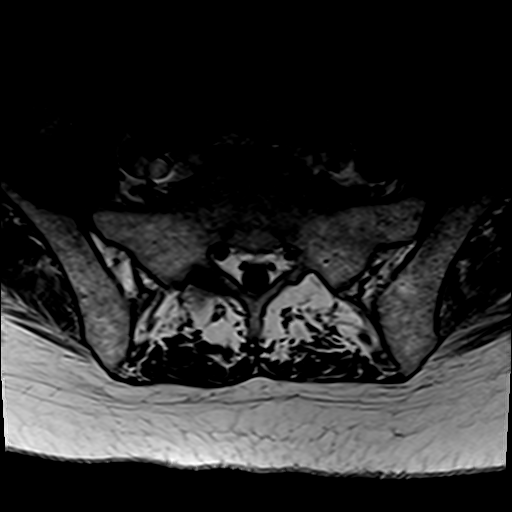
[im 6/36]
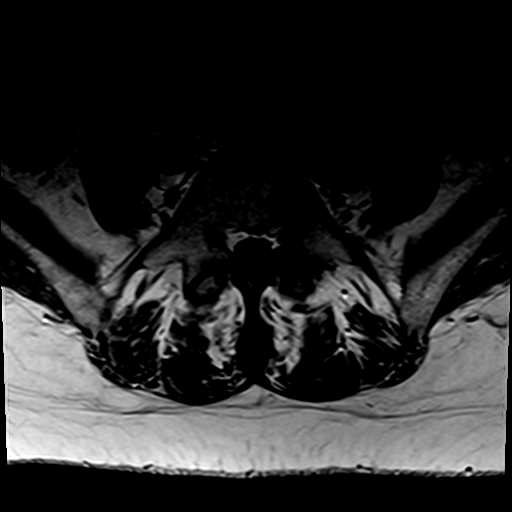
[im 11/36]
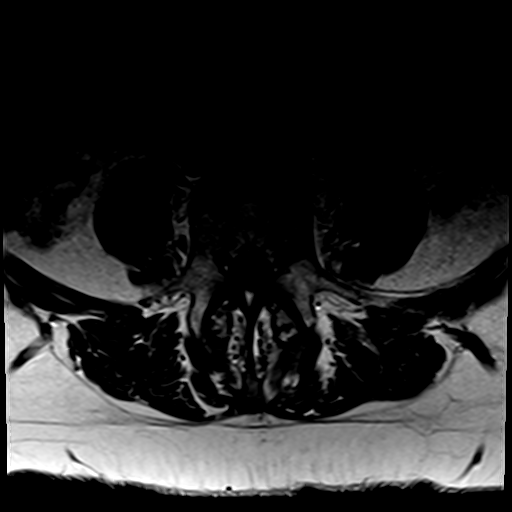
[im 16/36]
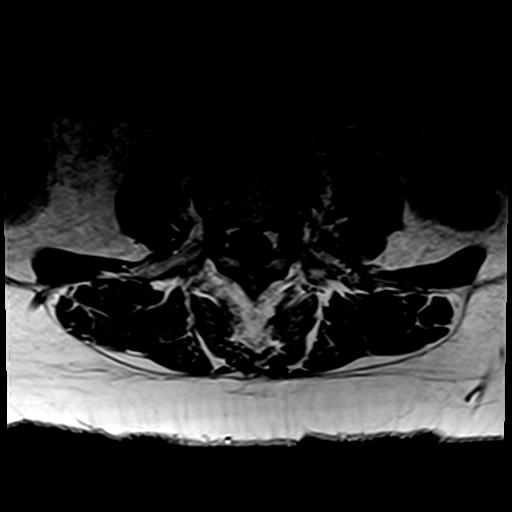
[im 18/36]
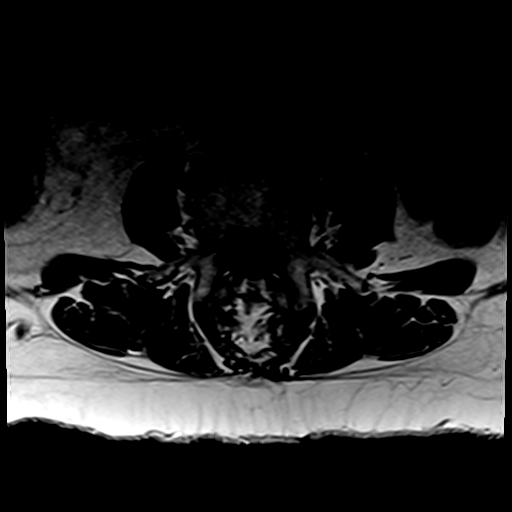
[im 21/36]
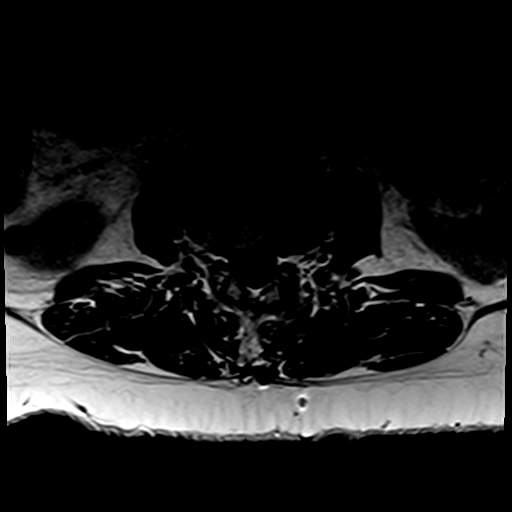
[im 26/36]
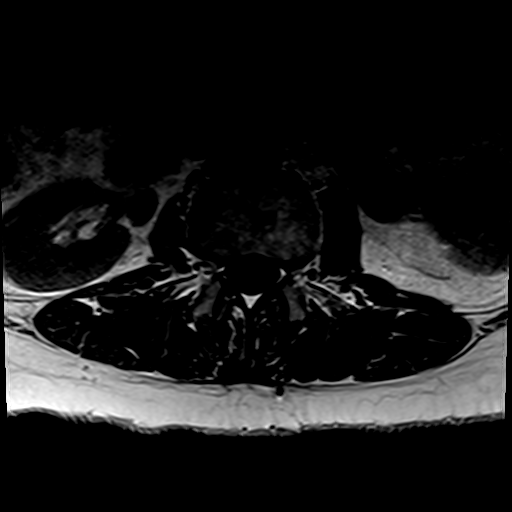
[im 31/36]
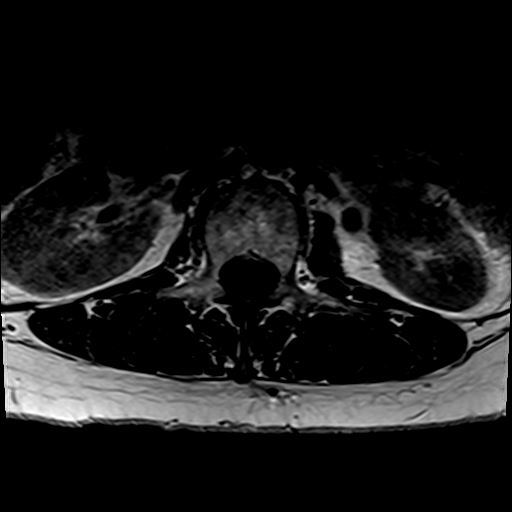
[im 36/36]
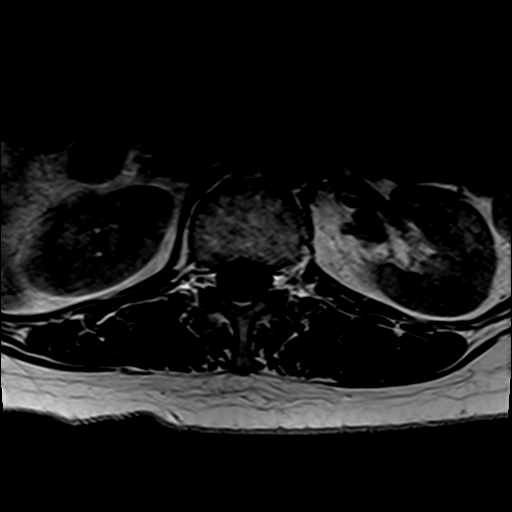

[34 of 48 positions shown; findings below may reference images not displayed]

FINDINGS: Segmentation:  5 lumbar type vertebrae

Alignment:  Slight retrolisthesis at L2-3 and L3-4.

Vertebrae: No fracture, evidence of discitis, or bone lesion.
Laminectomy at L2-3

Conus medullaris and cauda equina: Conus extends to the T12 level.
The cauda equina appears normal.

Paraspinal and other soft tissues: Negative

Disc levels:

T12- L1: Unremarkable.

L1-L2: Unremarkable.

L2-L3: Disc desiccation and narrowing with mild bulge. Laminectomy
with patent spinal canal

L3-L4: Left paracentral protrusion which could affect the left
descending L4 nerve root. Mild degenerative facet spurring. Mild
left foraminal narrowing

L4-L5: Mild disc bulging with shallow central protrusion. Annular
fissure. Negative facets. No impingement

L5-S1:Degenerative facet spurring asymmetric to the right. No
impingement
IMPRESSION: 1. L3-4 left paracentral protrusion which could affect the
descending L4 nerve root.
2. L2-3 laminectomy with patent canal.
